# Patient Record
Sex: Female | Born: 1950 | Race: White | Hispanic: No | Marital: Married | Smoking: Never smoker
Health system: Southern US, Community
[De-identification: ages and names within clinical notes are randomized; demographics above are authoritative.]

## PROBLEM LIST (undated history)

## (undated) DIAGNOSIS — J449 Chronic obstructive pulmonary disease, unspecified: Secondary | ICD-10-CM

## (undated) DIAGNOSIS — F329 Major depressive disorder, single episode, unspecified: Secondary | ICD-10-CM

## (undated) DIAGNOSIS — Z9889 Other specified postprocedural states: Secondary | ICD-10-CM

## (undated) DIAGNOSIS — C801 Malignant (primary) neoplasm, unspecified: Secondary | ICD-10-CM

## (undated) DIAGNOSIS — T753XXA Motion sickness, initial encounter: Secondary | ICD-10-CM

## (undated) DIAGNOSIS — J90 Pleural effusion, not elsewhere classified: Secondary | ICD-10-CM

## (undated) DIAGNOSIS — J45909 Unspecified asthma, uncomplicated: Secondary | ICD-10-CM

## (undated) DIAGNOSIS — E892 Postprocedural hypoparathyroidism: Secondary | ICD-10-CM

## (undated) DIAGNOSIS — D649 Anemia, unspecified: Secondary | ICD-10-CM

## (undated) DIAGNOSIS — B001 Herpesviral vesicular dermatitis: Secondary | ICD-10-CM

## (undated) DIAGNOSIS — R079 Chest pain, unspecified: Secondary | ICD-10-CM

## (undated) DIAGNOSIS — D72819 Decreased white blood cell count, unspecified: Secondary | ICD-10-CM

## (undated) DIAGNOSIS — J189 Pneumonia, unspecified organism: Secondary | ICD-10-CM

## (undated) DIAGNOSIS — R112 Nausea with vomiting, unspecified: Secondary | ICD-10-CM

## (undated) DIAGNOSIS — F4321 Adjustment disorder with depressed mood: Secondary | ICD-10-CM

## (undated) DIAGNOSIS — I83893 Varicose veins of bilateral lower extremities with other complications: Secondary | ICD-10-CM

## (undated) DIAGNOSIS — N3090 Cystitis, unspecified without hematuria: Secondary | ICD-10-CM

## (undated) DIAGNOSIS — I209 Angina pectoris, unspecified: Secondary | ICD-10-CM

## (undated) DIAGNOSIS — D696 Thrombocytopenia, unspecified: Secondary | ICD-10-CM

## (undated) DIAGNOSIS — F419 Anxiety disorder, unspecified: Secondary | ICD-10-CM

## (undated) DIAGNOSIS — K219 Gastro-esophageal reflux disease without esophagitis: Secondary | ICD-10-CM

## (undated) DIAGNOSIS — M1990 Unspecified osteoarthritis, unspecified site: Secondary | ICD-10-CM

## (undated) DIAGNOSIS — Z87442 Personal history of urinary calculi: Secondary | ICD-10-CM

## (undated) DIAGNOSIS — E039 Hypothyroidism, unspecified: Secondary | ICD-10-CM

## (undated) DIAGNOSIS — E538 Deficiency of other specified B group vitamins: Secondary | ICD-10-CM

## (undated) DIAGNOSIS — N393 Stress incontinence (female) (male): Secondary | ICD-10-CM

## (undated) DIAGNOSIS — G4700 Insomnia, unspecified: Secondary | ICD-10-CM

## (undated) DIAGNOSIS — R600 Localized edema: Secondary | ICD-10-CM

## (undated) DIAGNOSIS — R0600 Dyspnea, unspecified: Secondary | ICD-10-CM

## (undated) DIAGNOSIS — N200 Calculus of kidney: Secondary | ICD-10-CM

## (undated) DIAGNOSIS — F32A Depression, unspecified: Secondary | ICD-10-CM

## (undated) HISTORY — PX: OTHER SURGICAL HISTORY: SHX169

## (undated) HISTORY — PX: APPENDECTOMY: SHX54

## (undated) HISTORY — DX: Gastro-esophageal reflux disease without esophagitis: K21.9

## (undated) HISTORY — PX: TONSILLECTOMY: SUR1361

## (undated) HISTORY — DX: Cystitis, unspecified without hematuria: N30.90

## (undated) HISTORY — DX: Calculus of kidney: N20.0

## (undated) HISTORY — PX: KIDNEY STONE SURGERY: SHX686

## (undated) HISTORY — DX: Varicose veins of bilateral lower extremities with other complications: I83.893

## (undated) HISTORY — DX: Malignant (primary) neoplasm, unspecified: C80.1

## (undated) HISTORY — DX: Unspecified asthma, uncomplicated: J45.909

---
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
## 1981-03-16 HISTORY — PX: ABDOMINAL HYSTERECTOMY: SHX81

## 1986-03-16 HISTORY — PX: THYROID SURGERY: SHX805

## 1987-03-17 DIAGNOSIS — J45909 Unspecified asthma, uncomplicated: Secondary | ICD-10-CM

## 1987-03-17 DIAGNOSIS — C801 Malignant (primary) neoplasm, unspecified: Secondary | ICD-10-CM

## 1987-03-17 HISTORY — DX: Unspecified asthma, uncomplicated: J45.909

## 1987-03-17 HISTORY — PX: KNEE SURGERY: SHX244

## 1987-03-17 HISTORY — DX: Malignant (primary) neoplasm, unspecified: C80.1

## 2000-03-16 HISTORY — PX: CHOLECYSTECTOMY: SHX55

## 2004-03-11 ENCOUNTER — Ambulatory Visit: Payer: Self-pay | Admitting: Internal Medicine

## 2005-03-12 ENCOUNTER — Ambulatory Visit: Payer: Self-pay | Admitting: Internal Medicine

## 2005-09-01 ENCOUNTER — Ambulatory Visit: Payer: Self-pay | Admitting: Unknown Physician Specialty

## 2006-03-16 DIAGNOSIS — F4320 Adjustment disorder, unspecified: Secondary | ICD-10-CM

## 2006-03-16 DIAGNOSIS — F4321 Adjustment disorder with depressed mood: Secondary | ICD-10-CM

## 2006-03-16 HISTORY — DX: Adjustment disorder with depressed mood: F43.21

## 2006-03-16 HISTORY — DX: Adjustment disorder, unspecified: F43.20

## 2006-05-27 ENCOUNTER — Ambulatory Visit: Payer: Self-pay | Admitting: Internal Medicine

## 2006-06-06 ENCOUNTER — Emergency Department: Payer: Self-pay | Admitting: Emergency Medicine

## 2007-03-17 HISTORY — PX: COLONOSCOPY: SHX174

## 2007-05-30 ENCOUNTER — Ambulatory Visit: Payer: Self-pay | Admitting: Internal Medicine

## 2007-09-06 ENCOUNTER — Ambulatory Visit: Payer: Self-pay | Admitting: Internal Medicine

## 2008-06-06 ENCOUNTER — Ambulatory Visit: Payer: Self-pay | Admitting: Internal Medicine

## 2008-07-11 ENCOUNTER — Emergency Department: Payer: Self-pay | Admitting: Emergency Medicine

## 2009-03-16 HISTORY — PX: TOTAL THYROIDECTOMY: SHX2547

## 2009-05-07 ENCOUNTER — Inpatient Hospital Stay: Payer: Self-pay | Admitting: Internal Medicine

## 2009-05-24 ENCOUNTER — Ambulatory Visit: Payer: Self-pay | Admitting: Internal Medicine

## 2009-06-03 ENCOUNTER — Encounter: Payer: Self-pay | Admitting: Otolaryngology

## 2009-06-06 ENCOUNTER — Ambulatory Visit: Payer: Self-pay | Admitting: General Surgery

## 2009-06-11 ENCOUNTER — Ambulatory Visit: Payer: Self-pay | Admitting: Internal Medicine

## 2009-06-12 ENCOUNTER — Ambulatory Visit: Payer: Self-pay | Admitting: Specialist

## 2009-06-13 ENCOUNTER — Ambulatory Visit: Payer: Self-pay | Admitting: Internal Medicine

## 2009-06-13 ENCOUNTER — Ambulatory Visit: Payer: Self-pay | Admitting: General Surgery

## 2009-06-14 ENCOUNTER — Encounter: Payer: Self-pay | Admitting: Otolaryngology

## 2009-06-14 ENCOUNTER — Ambulatory Visit: Payer: Self-pay | Admitting: Internal Medicine

## 2009-06-25 ENCOUNTER — Ambulatory Visit: Payer: Self-pay | Admitting: Otolaryngology

## 2009-06-27 ENCOUNTER — Ambulatory Visit: Payer: Self-pay | Admitting: Otolaryngology

## 2009-07-03 ENCOUNTER — Inpatient Hospital Stay: Payer: Self-pay | Admitting: Internal Medicine

## 2009-07-12 ENCOUNTER — Ambulatory Visit: Payer: Self-pay | Admitting: Internal Medicine

## 2009-07-14 ENCOUNTER — Ambulatory Visit: Payer: Self-pay | Admitting: Internal Medicine

## 2009-12-13 ENCOUNTER — Ambulatory Visit: Payer: Self-pay | Admitting: General Surgery

## 2010-03-16 HISTORY — PX: THROAT SURGERY: SHX803

## 2010-06-16 ENCOUNTER — Ambulatory Visit: Payer: Self-pay | Admitting: General Surgery

## 2010-06-26 ENCOUNTER — Ambulatory Visit: Payer: Self-pay | Admitting: General Surgery

## 2011-02-12 ENCOUNTER — Ambulatory Visit: Payer: Self-pay | Admitting: Otolaryngology

## 2011-03-05 ENCOUNTER — Encounter: Payer: Self-pay | Admitting: Otolaryngology

## 2011-03-17 ENCOUNTER — Encounter: Payer: Self-pay | Admitting: Otolaryngology

## 2011-03-17 HISTORY — PX: UPPER GI ENDOSCOPY: SHX6162

## 2011-04-06 ENCOUNTER — Ambulatory Visit: Payer: Self-pay | Admitting: Unknown Physician Specialty

## 2011-04-07 LAB — PATHOLOGY REPORT

## 2011-04-17 ENCOUNTER — Encounter: Payer: Self-pay | Admitting: Otolaryngology

## 2011-05-15 ENCOUNTER — Encounter: Payer: Self-pay | Admitting: Otolaryngology

## 2011-06-24 ENCOUNTER — Ambulatory Visit: Payer: Self-pay | Admitting: General Surgery

## 2012-03-16 DIAGNOSIS — N200 Calculus of kidney: Secondary | ICD-10-CM

## 2012-03-16 HISTORY — DX: Calculus of kidney: N20.0

## 2012-03-16 HISTORY — PX: KIDNEY STONE SURGERY: SHX686

## 2012-04-08 ENCOUNTER — Ambulatory Visit: Payer: Self-pay | Admitting: Internal Medicine

## 2012-04-12 ENCOUNTER — Ambulatory Visit: Payer: Self-pay | Admitting: Urology

## 2012-04-16 DIAGNOSIS — N200 Calculus of kidney: Secondary | ICD-10-CM | POA: Insufficient documentation

## 2012-04-18 ENCOUNTER — Ambulatory Visit: Payer: Self-pay | Admitting: Urology

## 2012-04-18 LAB — CBC WITH DIFFERENTIAL/PLATELET
Basophil #: 0 10*3/uL (ref 0.0–0.1)
Basophil %: 0.5 %
Eosinophil #: 0.1 10*3/uL (ref 0.0–0.7)
Eosinophil %: 1.6 %
HCT: 42.5 % (ref 35.0–47.0)
HGB: 13.8 g/dL (ref 12.0–16.0)
Lymphocyte #: 1.4 10*3/uL (ref 1.0–3.6)
Lymphocyte %: 22 %
MCHC: 32.4 g/dL (ref 32.0–36.0)
MCV: 96 fL (ref 80–100)
Monocyte #: 0.5 x10 3/mm (ref 0.2–0.9)
Monocyte %: 7.9 %
Neutrophil #: 4.3 10*3/uL (ref 1.4–6.5)
Platelet: 181 10*3/uL (ref 150–440)

## 2012-04-20 ENCOUNTER — Ambulatory Visit: Payer: Self-pay | Admitting: Urology

## 2012-05-07 ENCOUNTER — Encounter: Payer: Self-pay | Admitting: *Deleted

## 2012-06-24 ENCOUNTER — Ambulatory Visit: Payer: Self-pay | Admitting: General Surgery

## 2012-06-27 ENCOUNTER — Encounter: Payer: Self-pay | Admitting: General Surgery

## 2012-07-11 ENCOUNTER — Encounter: Payer: Self-pay | Admitting: General Surgery

## 2012-07-11 ENCOUNTER — Ambulatory Visit (INDEPENDENT_AMBULATORY_CARE_PROVIDER_SITE_OTHER): Payer: BC Managed Care – PPO | Admitting: General Surgery

## 2012-07-11 ENCOUNTER — Other Ambulatory Visit: Payer: Self-pay | Admitting: *Deleted

## 2012-07-11 VITALS — BP 140/80 | HR 86 | Resp 16 | Ht 70.0 in | Wt 293.0 lb

## 2012-07-11 DIAGNOSIS — Z1231 Encounter for screening mammogram for malignant neoplasm of breast: Secondary | ICD-10-CM

## 2012-07-11 DIAGNOSIS — Z1239 Encounter for other screening for malignant neoplasm of breast: Secondary | ICD-10-CM | POA: Insufficient documentation

## 2012-07-11 DIAGNOSIS — I83893 Varicose veins of bilateral lower extremities with other complications: Secondary | ICD-10-CM

## 2012-07-11 DIAGNOSIS — Z85850 Personal history of malignant neoplasm of thyroid: Secondary | ICD-10-CM | POA: Insufficient documentation

## 2012-07-11 NOTE — Progress Notes (Signed)
Patient ID: Maureen Brown, female   DOB: March 01, 1951, 62 y.o.   MRN: [id number]  Chief Complaint  Patient presents with  . Follow-up    mammogram     HPI Maureen Brown is a 62 y.o. female who presents for a follow up mammogram. The most recent mammogram was done on 06/24/12 with a birad category 2. The patient denies any new breasts problems at this time. The patient has a maternal grandmother with a history of breast cancer. The patient checks her breast regularly and gets regular mammograms. She previously was seen for a right breast lipoma in March 2014. Patient has also has varicose veins, prior Duplex study showed incompetent left GSV. Patient is wearing compression stockings with minimal relief of symptoms. The problem is mostly at night time with aching over the varicose veins and spider veins.  HPI  Past Medical History  Diagnosis Date  . Asthma 1989  . Cystitis, unspecified   . 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 Cancer 1989    thyroid  . Varicose veins of lower extremities with other complications   . GERD (gastroesophageal reflux disease)   . Kidney stone 2014    Past Surgical History  Procedure Laterality Date  . Colonoscopy  2009    Dr. Mechele Collin  . Breast biopsy Left 1973  . Knee surgery Right 1989  . Abdominal hysterectomy  1983  . Thyroid surgery  1988  . Cholecystectomy  2002  . Tonsillectomy    . Upper gi endoscopy  2013  . Throat surgery  2012    vocal cord surgery  . Kidney stone surgery  2014    Family History  Problem Relation Age of Onset  . Cancer Maternal Grandmother     breast    Social History History  Substance Use Topics  . Smoking status: Never Smoker   . Smokeless tobacco: Not on file  . Alcohol Use: Yes    Allergies  Allergen Reactions  . Codeine Nausea Only    dizziness  . Sulfa Antibiotics Hives  . Demerol (Meperidine) Rash    Throat itching  . Morphine And Related Rash     Current Outpatient Prescriptions  Medication Sig Dispense Refill  . ALPRAZolam (XANAX) 0.25 MG tablet Take 0.25 mg by mouth every 6 (six) hours as needed for sleep.      . budesonide-formoterol (SYMBICORT) 80-4.5 MCG/ACT inhaler Inhale 2 puffs into the lungs 2 (two) times daily.      . calcitRIOL (ROCALTROL) 0.5 MCG capsule Take 0.5 mcg by mouth 2 (two) times daily.      . Cholecalciferol (VITAMIN D) 1000 UNITS capsule Take 1,000 Units by mouth daily.      . furosemide (LASIX) 20 MG tablet Take 20 mg by mouth as needed.      . levalbuterol (XOPENEX HFA) 45 MCG/ACT inhaler Inhale 1-2 puffs into the lungs 2 (two) times daily as needed for wheezing.      Marland Kitchen levothyroxine (SYNTHROID, LEVOTHROID) 125 MCG tablet Take 125 mcg by mouth daily.      . naproxen (NAPROSYN) 500 MG tablet Take 500 mg by mouth as needed.       Marland Kitchen omeprazole (PRILOSEC) 40 MG capsule Take 40 mg by mouth 2 (two) times daily.      . ranitidine (ZANTAC) 150 MG capsule Take 150 mg by mouth every evening.      . valACYclovir (VALTREX) 1000 MG tablet Take 1,000 mg by  mouth 2 (two) times daily as needed.        No current facility-administered medications for this visit.    Review of Systems Review of Systems  Constitutional: Negative.   Respiratory: Negative.   Cardiovascular: Negative.     Blood pressure 140/80, pulse 86, resp. rate 16, height 5\' 10"  (1.778 m), weight 293 lb (132.904 kg).  Physical Exam Physical Exam  Constitutional: She is oriented to person, place, and time. She appears well-developed and well-nourished.  Eyes: Conjunctivae are normal. No scleral icterus.  Neck: Trachea normal. No mass and no thyromegaly present.  Cardiovascular: Normal rate, regular rhythm, normal heart sounds and normal pulses.   No murmur heard. Pulses:      Dorsalis pedis pulses are 2+ on the right side, and 2+ on the left side.       Posterior tibial pulses are 2+ on the right side, and 2+ on the left side.  Varicose Veins  noted at the inner aspect of left thigh and calf. A few on the right side laterally. Spider veins noted in lateral left calf. Mild skin discoloration medially above left ankle.   Pulmonary/Chest: Effort normal and breath sounds normal. Right breast exhibits no inverted nipple, no mass, no nipple discharge, no skin change and no tenderness. Left breast exhibits no inverted nipple, no mass, no nipple discharge, no skin change and no tenderness. Breasts are symmetrical.  Small lipoma at the 8 o'clock position from the nipple of the right breast.   Abdominal: Soft. Normal appearance and bowel sounds are normal. There is no hepatosplenomegaly. There is no tenderness. No hernia.  Lymphadenopathy:    She has no cervical adenopathy.    She has no axillary adenopathy.  Neurological: She is alert and oriented to person, place, and time.  Skin: Skin is warm and dry.    Data Reviewed Mammogram reviewed-stable  Assessment    Breast screening, VV, stable exam.     Plan    1 yr f/u for breast and VV. Discussed options of RF ablation . Sclerotherapy.        SANKAR,SEEPLAPUTHUR G 07/11/2012, 9:49 AM

## 2012-07-11 NOTE — Progress Notes (Signed)
Patient will be asked to return to the office in one year for a bilateral screening mammogram. 

## 2012-07-11 NOTE — Patient Instructions (Addendum)
Continue with compression stockings. Patient to think about having RF ablation of left GSV and or scleratherapy of spider veins.  Mammogram in 1 year with follow up visit.

## 2012-08-03 DIAGNOSIS — R82994 Hypercalciuria: Secondary | ICD-10-CM | POA: Insufficient documentation

## 2012-08-03 DIAGNOSIS — R82992 Hyperoxaluria: Secondary | ICD-10-CM | POA: Insufficient documentation

## 2012-08-10 ENCOUNTER — Ambulatory Visit (INDEPENDENT_AMBULATORY_CARE_PROVIDER_SITE_OTHER): Payer: BC Managed Care – PPO | Admitting: General Surgery

## 2012-08-10 ENCOUNTER — Encounter: Payer: Self-pay | Admitting: General Surgery

## 2012-08-10 VITALS — BP 134/94 | HR 72 | Resp 16 | Ht 70.0 in | Wt 294.0 lb

## 2012-08-10 DIAGNOSIS — I83893 Varicose veins of bilateral lower extremities with other complications: Secondary | ICD-10-CM

## 2012-08-10 NOTE — Progress Notes (Signed)
Patient ID: Maureen Brown, female   DOB: 09-10-1950, 62 y.o.   MRN: [id number]  Chief Complaint  Patient presents with  . Follow-up    leg  reassessment    HPI Maureen Brown is a 62 y.o. female.  Patient here today for follow up has varicose veins, prior Duplex study showed incompetent left GSV. Patient is wearing compression stockings with minimal relief of symptoms. The problem is mostly at night time with aching over the varicose veins and spider veins. Left leg feels heavy at night and throbs.  She has noticed swelling at the end of the day. Frequently uses Tylenol for symptoms. Now ither left leg symptoms seem to interfere with household chores and her work also.Concerned about generalized aching and swelling in left leg and focal discomfort over a cluster of veins lateral left calf. Few mos ago she decided to just treat the spider veins but since then she has noted more generalised left leg symptoms and wishes to proceed with RF ablation and stab phlebectomy/sclerotherapy.  HPI  Past Medical History  Diagnosis Date  . Asthma 1989  . Cystitis, unspecified   . 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 Cancer 1989    thyroid  . Varicose veins of lower extremities with other complications   . GERD (gastroesophageal reflux disease)   . Kidney stone 2014    Past Surgical History  Procedure Laterality Date  . Colonoscopy  2009    Dr. Mechele Collin  . Breast biopsy Left 1973  . Knee surgery Right 1989  . Abdominal hysterectomy  1983  . Thyroid surgery  1988  . Cholecystectomy  2002  . Tonsillectomy    . Upper gi endoscopy  2013  . Throat surgery  2012    vocal cord surgery  . Kidney stone surgery  2014    Family History  Problem Relation Age of Onset  . Cancer Maternal Grandmother     breast    Social History History  Substance Use Topics  . Smoking status: Never Smoker   . Smokeless tobacco: Not on file  . Alcohol Use: Yes     Allergies  Allergen Reactions  . Codeine Nausea Only    dizziness  . Sulfa Antibiotics Hives  . Demerol (Meperidine) Rash    Throat itching  . Morphine And Related Rash    Current Outpatient Prescriptions  Medication Sig Dispense Refill  . ALPRAZolam (XANAX) 0.25 MG tablet Take 0.25 mg by mouth every 6 (six) hours as needed for sleep.      . budesonide-formoterol (SYMBICORT) 80-4.5 MCG/ACT inhaler Inhale 2 puffs into the lungs 2 (two) times daily.      . calcitRIOL (ROCALTROL) 0.5 MCG capsule Take 0.5 mcg by mouth 2 (two) times daily.      . Cholecalciferol (VITAMIN D) 1000 UNITS capsule Take 1,000 Units by mouth daily.      . furosemide (LASIX) 20 MG tablet Take 20 mg by mouth as needed.      . levalbuterol (XOPENEX HFA) 45 MCG/ACT inhaler Inhale 1-2 puffs into the lungs 2 (two) times daily as needed for wheezing.      Marland Kitchen levothyroxine (SYNTHROID, LEVOTHROID) 125 MCG tablet Take 125 mcg by mouth daily.      . naproxen (NAPROSYN) 500 MG tablet Take 500 mg by mouth as needed.       Marland Kitchen omeprazole (PRILOSEC) 40 MG capsule Take 40 mg by mouth 2 (two) times daily.      Marland Kitchen  ranitidine (ZANTAC) 150 MG capsule Take 150 mg by mouth every evening.      . valACYclovir (VALTREX) 1000 MG tablet Take 1,000 mg by mouth 2 (two) times daily as needed.        No current facility-administered medications for this visit.    Review of Systems Review of Systems  Constitutional: Negative.   Respiratory: Negative.   Cardiovascular: Negative.     Blood pressure 134/94, pulse 72, resp. rate 16, height 5\' 10"  (1.778 m), weight 294 lb (133.358 kg).  Physical Exam Physical Exam  Constitutional: She is oriented to person, place, and time. She appears well-developed and well-nourished.  Neurological: She is alert and oriented to person, place, and time.  Skin: Skin is warm and dry.  Varicose veins seen in left medial thigh up to 8 mm size and scattered cluster of spider veins and retciuveins in calf  area. Mild edema in left ankle.  Right leg free of similar findings. Pedal pulses are strong.  Data Reviewed Prior duplex was reviewed.  Limited duplex study of left thigh shows again left GVS lateral branch with reflux in excess of 2.5 seconds. The main GSV is normal. Varicose veins connect to lateral branch.  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 Failed conservative managment.   Patient is willing to undergo RF ablation of the GSV in left leg with stab pheblectomy with sclerotherapy after. Plan    Will reschedule about procedures after insurance verification.    Limited Duplex study of left thigh was performed. The pt has a prominent lateral branch of GSV , measures 0.8 cm. It is not tortuous or aneurysmal. It has reflux in excess of 2.8 secs. VV connect to this in distal thigh. The main GSV is small and has no reflux. Prior Duplex study has shown no deep vein abnormality and a normal LSV   Rutger Salton G 08/10/2012, 12:54 PM

## 2012-08-10 NOTE — Patient Instructions (Addendum)
The patient is aware to call back for any questions or concerns. Procedure reviewed-RF ablation, stab phlebectomy and sclerotherapy.Pt is agreeable.

## 2012-08-18 ENCOUNTER — Telehealth: Payer: Self-pay | Admitting: *Deleted

## 2012-08-18 NOTE — Telephone Encounter (Signed)
Left message for pt to call to review dates for vein procedures to make sure they are ok with her.

## 2012-09-13 ENCOUNTER — Encounter: Payer: Self-pay | Admitting: General Surgery

## 2012-09-13 HISTORY — PX: ABLATION SAPHENOUS VEIN W/ RFA: SUR11

## 2012-10-11 ENCOUNTER — Encounter: Payer: Self-pay | Admitting: General Surgery

## 2012-10-11 ENCOUNTER — Ambulatory Visit (INDEPENDENT_AMBULATORY_CARE_PROVIDER_SITE_OTHER): Payer: BC Managed Care – PPO | Admitting: General Surgery

## 2012-10-11 VITALS — BP 138/94 | HR 80 | Resp 14 | Ht 70.0 in | Wt 297.0 lb

## 2012-10-11 DIAGNOSIS — I83893 Varicose veins of bilateral lower extremities with other complications: Secondary | ICD-10-CM

## 2012-10-11 NOTE — Progress Notes (Signed)
Subjective:     Patient ID: Maureen Brown, female   DOB: [birth year], 62 y.o.   MRN: [id number]  HPI Patient here today for left leg ALV vein procedure.  Review of Systems     Objective:   Physical Exam     Assessment:     VV left symptomatic     Plan:     RF ablation left GSV done today      ENDOVENOUS RADIOFREQUENCY ABLATION REPORT  Name:  Maureen Brown DOB:  15-Apr-1950  Diagnosis:  Reflux with multiple tributary varicosities on left Operation: Endovenous radiofrequency ablation of theleft GSV-l;ateral branch Vital signs:BP 138/94  Pulse 80  Resp 14  Ht 5\' 10"  (1.778 m)  Wt 297 lb (134.718 kg)  BMI 42.61 kg/m2  Anesthesia:   Local infiltration of 6ml 1% Xylocaine and 75ml of tumenscence  Procedure:  The patient was transferred to the procedure suite and the insufficient vein was mapped by ultrasound and marked on the overlying skin. The patient was then positioned supine on the procedure table.  The limb was sterilely prepped and draped. The RF closurefast catheter was placed in a sterile field.  The patient was placed in reverse-Trendelenburg position and local anesthesia was instilled in the skin overlying the access site. A skin incision was made overlying the identified and mapped entry site. The vein was punctured through the incision, and using ultrasound guidance and the Seldinger technique, a guidewire was introduced through the needle, which was then exchanged over the guidewire for a sheath. The guidewire was removed and the sheath was flushed. The RF probe was placed into the vein through the sheath and positioned at a point just distal (about 2 cm) to the junction using ultrasound guidance.  After the RF probe position was verified by ultrasound, tumescent anesthesia was infiltrated, under ultrasound guidance, precisely into the perivenous compartment along the entire length of the vein from the entry site to the saphenofemoral junction until a "halo" of  fluid was noted from the vein.  The patient was then placed in Trendelenburg position. RF energy was applied to the 4 segments of 3.0 cm each.  Repeat ultrasound of the saphenous vein was performed, confirming successful treatment. The catheter and sheath were withdrawn and hemostasis established with direct pressure.  After assuring hemostasis, the skin incision over the saphenous vein was closed with a bandage and a compression wrap. Patient experienced no immediate complications.  CC:  Maureen Brown

## 2012-10-11 NOTE — Patient Instructions (Addendum)
Instructions reviewed with patient Follow up as scheduled Leave compression wrap in place left leg

## 2012-10-13 ENCOUNTER — Ambulatory Visit (INDEPENDENT_AMBULATORY_CARE_PROVIDER_SITE_OTHER): Payer: BC Managed Care – PPO | Admitting: General Surgery

## 2012-10-13 ENCOUNTER — Encounter: Payer: Self-pay | Admitting: General Surgery

## 2012-10-13 VITALS — BP 130/74 | Ht 70.0 in | Wt 296.0 lb

## 2012-10-13 DIAGNOSIS — I83893 Varicose veins of bilateral lower extremities with other complications: Secondary | ICD-10-CM

## 2012-10-13 NOTE — Patient Instructions (Addendum)
Wear compression stockings. Follow up as scheduled.

## 2012-10-13 NOTE — Progress Notes (Signed)
Patient ID: Maureen Brown, female   DOB: 12/21/1950, 62 y.o.   MRN: [id number] Patient here for 2 day post left leg ALV ablation follow up. She states that she is doing well.   Puncture sites are clean, no leg edema.  Ultrasound preformed today. Left CFV is normal. Treated GSV is non compressible and no flow in it. It stays like this for the 12-14 cm wfre it is connected with a large tributary  Successful treatment. Continue with compression stockings. Return as scheduled for stab phlebectomy.

## 2012-10-14 ENCOUNTER — Encounter: Payer: Self-pay | Admitting: General Surgery

## 2012-10-25 ENCOUNTER — Encounter: Payer: Self-pay | Admitting: General Surgery

## 2012-10-25 ENCOUNTER — Ambulatory Visit (INDEPENDENT_AMBULATORY_CARE_PROVIDER_SITE_OTHER): Payer: BC Managed Care – PPO | Admitting: General Surgery

## 2012-10-25 VITALS — BP 126/84 | HR 78 | Resp 16 | Ht 70.0 in | Wt 297.0 lb

## 2012-10-25 DIAGNOSIS — I83893 Varicose veins of bilateral lower extremities with other complications: Secondary | ICD-10-CM

## 2012-10-25 NOTE — Progress Notes (Signed)
Patient here today for left leg stab phlebectomy. States left leg has been swollen some and tender in left upper thigh. Exam showed mild redness over the VV in lower left thigh. NO VV visible. It is possible this will allow the VV to sclerose.  Will reassess in 3 weeks.

## 2012-10-25 NOTE — Patient Instructions (Addendum)
Aleve for comfort as needed Wear compression hose

## 2012-10-27 ENCOUNTER — Ambulatory Visit: Payer: Self-pay | Admitting: General Surgery

## 2012-10-28 ENCOUNTER — Telehealth: Payer: Self-pay | Admitting: General Surgery

## 2012-10-28 NOTE — Telephone Encounter (Signed)
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

## 2012-11-17 ENCOUNTER — Ambulatory Visit (INDEPENDENT_AMBULATORY_CARE_PROVIDER_SITE_OTHER): Payer: BC Managed Care – PPO | Admitting: General Surgery

## 2012-11-17 ENCOUNTER — Encounter: Payer: Self-pay | Admitting: General Surgery

## 2012-11-17 VITALS — BP 134/72 | HR 76 | Resp 12 | Ht 71.0 in | Wt 296.0 lb

## 2012-11-17 DIAGNOSIS — I781 Nevus, non-neoplastic: Secondary | ICD-10-CM

## 2012-11-17 DIAGNOSIS — I83893 Varicose veins of bilateral lower extremities with other complications: Secondary | ICD-10-CM

## 2012-11-17 DIAGNOSIS — D4701 Cutaneous mastocytosis: Secondary | ICD-10-CM | POA: Insufficient documentation

## 2012-11-17 NOTE — Patient Instructions (Addendum)
Compression hose to be used daily.

## 2012-11-17 NOTE — Progress Notes (Signed)
Patient ID: Maureen Brown, female   DOB: 1950/05/13, 62 y.o.   MRN: [id number]  Chief Complaint  Patient presents with  . Procedure    left leg sclerotherapy    HPI Maureen Brown is a 62 y.o. female who presents for a left leg sclerotherapy procedure.   HPI  Past Medical History  Diagnosis Date  . Asthma 1989  . Cystitis, unspecified   . 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 Cancer 1989    thyroid  . Varicose veins of lower extremities with other complications   . GERD (gastroesophageal reflux disease)   . Kidney stone 2014    Past Surgical History  Procedure Laterality Date  . Colonoscopy  2009    Dr. Mechele Collin  . Breast biopsy Left 1973  . Knee surgery Right 1989  . Abdominal hysterectomy  1983  . Thyroid surgery  1988  . Cholecystectomy  2002  . Tonsillectomy    . Upper gi endoscopy  2013  . Throat surgery  2012    vocal cord surgery  . Kidney stone surgery  2014    Family History  Problem Relation Age of Onset  . Cancer Maternal Grandmother     breast    Social History History  Substance Use Topics  . Smoking status: Never Smoker   . Smokeless tobacco: Not on file  . Alcohol Use: Yes    Allergies  Allergen Reactions  . Codeine Nausea Only    dizziness  . Sulfa Antibiotics Hives  . Demerol [Meperidine] Rash    Throat itching  . Morphine And Related Rash    Current Outpatient Prescriptions  Medication Sig Dispense Refill  . ALPRAZolam (XANAX) 0.25 MG tablet Take 0.25 mg by mouth every 6 (six) hours as needed for sleep.      . budesonide-formoterol (SYMBICORT) 80-4.5 MCG/ACT inhaler Inhale 2 puffs into the lungs 2 (two) times daily.      . calcitRIOL (ROCALTROL) 0.5 MCG capsule Take 0.5 mcg by mouth 2 (two) times daily.      . Cholecalciferol (VITAMIN D) 1000 UNITS capsule Take 1,000 Units by mouth daily.      . furosemide (LASIX) 20 MG tablet Take 20 mg by mouth as needed.      . levalbuterol  (XOPENEX HFA) 45 MCG/ACT inhaler Inhale 1-2 puffs into the lungs 2 (two) times daily as needed for wheezing.      Marland Kitchen levothyroxine (SYNTHROID, LEVOTHROID) 125 MCG tablet Take 125 mcg by mouth daily.      . naproxen (NAPROSYN) 500 MG tablet Take 500 mg by mouth as needed.       Marland Kitchen omeprazole (PRILOSEC) 40 MG capsule Take 40 mg by mouth 2 (two) times daily.      . ranitidine (ZANTAC) 150 MG capsule Take 150 mg by mouth every evening.      . valACYclovir (VALTREX) 1000 MG tablet Take 1,000 mg by mouth 2 (two) times daily as needed.        No current facility-administered medications for this visit.    Review of Systems Review of Systems  Constitutional: Negative.   Respiratory: Negative.   Cardiovascular: Negative.     Blood pressure 134/72, pulse 76, resp. rate 12, height 5\' 11"  (1.803 m), weight 296 lb (134.265 kg).  Physical Exam Physical Exam  Constitutional: She appears well-developed and well-nourished.  Neurological: She is alert.  Skin: Skin is warm and dry.  All varicose veins in left  leg have  resolved.  Data Reviewed none  Assessment    Superficial phlebitis resolved.  Varicose veins not visible. Spider veins in the left calf and knee area 3 clusters mildly symptomatic. Sclerotherapy preformed on these.    Plan    1ml of 1% Sotradecol mixed with 1ml saline Used. 30 g needle employed to inject the spider clusters.         SANKAR,SEEPLAPUTHUR G 11/17/2012, 6:42 PM

## 2012-12-08 ENCOUNTER — Ambulatory Visit (INDEPENDENT_AMBULATORY_CARE_PROVIDER_SITE_OTHER): Payer: BC Managed Care – PPO | Admitting: General Surgery

## 2012-12-08 ENCOUNTER — Encounter: Payer: Self-pay | Admitting: General Surgery

## 2012-12-08 VITALS — BP 118/80 | HR 80 | Resp 16 | Ht 70.0 in | Wt 301.0 lb

## 2012-12-08 DIAGNOSIS — I83893 Varicose veins of bilateral lower extremities with other complications: Secondary | ICD-10-CM

## 2012-12-08 NOTE — Progress Notes (Signed)
Patient ID: Maureen Brown, female   DOB: [birth year], 62 y.o.   MRN: [id number]  Chief Complaint  Patient presents with  . Other    spider veins    HPI Maureen Brown is a 62 y.o. female who presents for a 3 week follow up for varicose veins and spider veins. She denies any new complaints at this time.    HPI  Past Medical History  Diagnosis Date  . Asthma 1989  . Cystitis, unspecified   . 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 Cancer 1989    thyroid  . Varicose veins of lower extremities with other complications   . GERD (gastroesophageal reflux disease)   . Kidney stone 2014    Past Surgical History  Procedure Laterality Date  . Colonoscopy  2009    Dr. Mechele Collin  . Breast biopsy Left 1973  . Knee surgery Right 1989  . Abdominal hysterectomy  1983  . Thyroid surgery  1988  . Cholecystectomy  2002  . Tonsillectomy    . Upper gi endoscopy  2013  . Throat surgery  2012    vocal cord surgery  . Kidney stone surgery  2014    Family History  Problem Relation Age of Onset  . Cancer Maternal Grandmother     breast    Social History History  Substance Use Topics  . Smoking status: Never Smoker   . Smokeless tobacco: Not on file  . Alcohol Use: Yes    Allergies  Allergen Reactions  . Codeine Nausea Only    dizziness  . Sulfa Antibiotics Hives  . Demerol [Meperidine] Rash    Throat itching  . Morphine And Related Rash    Current Outpatient Prescriptions  Medication Sig Dispense Refill  . ALPRAZolam (XANAX) 0.25 MG tablet Take 0.25 mg by mouth every 6 (six) hours as needed for sleep.      . budesonide-formoterol (SYMBICORT) 80-4.5 MCG/ACT inhaler Inhale 2 puffs into the lungs 2 (two) times daily.      . calcitRIOL (ROCALTROL) 0.5 MCG capsule Take 0.5 mcg by mouth 2 (two) times daily.      . Cholecalciferol (VITAMIN D) 1000 UNITS capsule Take 1,000 Units by mouth daily.      . furosemide (LASIX) 20 MG tablet  Take 20 mg by mouth as needed.      . levalbuterol (XOPENEX HFA) 45 MCG/ACT inhaler Inhale 1-2 puffs into the lungs 2 (two) times daily as needed for wheezing.      Marland Kitchen levothyroxine (SYNTHROID, LEVOTHROID) 125 MCG tablet Take 125 mcg by mouth daily.      . naproxen (NAPROSYN) 500 MG tablet Take 500 mg by mouth as needed.       Marland Kitchen omeprazole (PRILOSEC) 40 MG capsule Take 40 mg by mouth 2 (two) times daily.      . ranitidine (ZANTAC) 150 MG capsule Take 150 mg by mouth every evening.      . valACYclovir (VALTREX) 1000 MG tablet Take 1,000 mg by mouth 2 (two) times daily as needed.        No current facility-administered medications for this visit.    Review of Systems Review of Systems  Constitutional: Negative.   Respiratory: Negative.   Cardiovascular: Negative.     Blood pressure 118/80, pulse 80, resp. rate 16, height 5\' 10"  (1.778 m), weight 301 lb (136.533 kg).  Physical Exam Physical Exam Injected spider veins in the left lower thigh and calf look  better. No skin involvement noted. No varicose veins noticeable.   Data Reviewed None  Assessment    Good results post RF ablation of GSV and scleratherapy. Patient symptomatically much improved.     Plan    Follow up in 6 months or as needed.        [name] G 12/08/2012, 7:11 PM

## 2012-12-08 NOTE — Patient Instructions (Addendum)
Patient advised to continue wearing her compression stockings. Patient to return in 7 months.

## 2013-05-05 ENCOUNTER — Ambulatory Visit: Payer: Self-pay | Admitting: Urology

## 2013-05-31 DIAGNOSIS — N393 Stress incontinence (female) (male): Secondary | ICD-10-CM | POA: Insufficient documentation

## 2013-05-31 DIAGNOSIS — N3941 Urge incontinence: Secondary | ICD-10-CM | POA: Insufficient documentation

## 2013-06-13 ENCOUNTER — Ambulatory Visit: Payer: Self-pay | Admitting: Urology

## 2013-06-16 DIAGNOSIS — N23 Unspecified renal colic: Secondary | ICD-10-CM | POA: Insufficient documentation

## 2013-06-26 ENCOUNTER — Ambulatory Visit: Payer: Self-pay | Admitting: Urology

## 2013-06-26 ENCOUNTER — Ambulatory Visit: Payer: Self-pay | Admitting: General Surgery

## 2013-06-27 ENCOUNTER — Ambulatory Visit: Payer: Self-pay | Admitting: Urology

## 2013-07-17 ENCOUNTER — Encounter: Payer: Self-pay | Admitting: General Surgery

## 2013-07-17 ENCOUNTER — Ambulatory Visit (INDEPENDENT_AMBULATORY_CARE_PROVIDER_SITE_OTHER): Payer: BC Managed Care – PPO | Admitting: General Surgery

## 2013-07-17 VITALS — BP 132/78 | HR 74 | Resp 14 | Ht 70.0 in | Wt 288.0 lb

## 2013-07-17 DIAGNOSIS — Z1239 Encounter for other screening for malignant neoplasm of breast: Secondary | ICD-10-CM

## 2013-07-17 DIAGNOSIS — Z85850 Personal history of malignant neoplasm of thyroid: Secondary | ICD-10-CM

## 2013-07-17 DIAGNOSIS — I83893 Varicose veins of bilateral lower extremities with other complications: Secondary | ICD-10-CM

## 2013-07-17 NOTE — Progress Notes (Signed)
Patient ID: Maureen Brown, female   DOB: 1950/09/18, 63 y.o.   MRN: [id number]  Chief Complaint  Patient presents with  . Follow-up    breast and varicose veins    HPI Maureen Brown is a 63 y.o. female who presents for a breast evaluation. The most recent mammogram was done on 06/26/13. She denies any new problems.  Patient does perform regular self breast checks and gets regular mammograms done. The patient is also here for vein check of the legs. She recently had kidney stone removal surgery on 06/27/13.  HPI  Past Medical History  Diagnosis Date  . Asthma 1989  . Cystitis, unspecified   . Varicose veins of lower extremities with other complications   . GERD (gastroesophageal reflux disease)   . Kidney stone 2014  . Cancer 1989    thyroid    Past Surgical History  Procedure Laterality Date  . Colonoscopy  2009    Dr. Vira Agar  . Breast biopsy Left 1973  . Knee surgery Right 1989  . Abdominal hysterectomy  1983  . Thyroid surgery  1988  . Cholecystectomy  2002  . Tonsillectomy    . Upper gi endoscopy  2013  . Throat surgery  2012    vocal cord surgery  . Kidney stone surgery  2014  . Kidney stone surgery  06/27/13    Dr Jacqlyn Larsen  . Ablation saphenous vein w/ rfa Left 09/2012    GSV    Family History  Problem Relation Age of Onset  . Cancer Maternal Grandmother     breast    Social History History  Substance Use Topics  . Smoking status: Never Smoker   . Smokeless tobacco: Never Used  . Alcohol Use: Yes    Allergies  Allergen Reactions  . Codeine Nausea Only    dizziness  . Sulfa Antibiotics Hives  . Demerol [Meperidine] Rash    Throat itching  . Morphine And Related Rash    Current Outpatient Prescriptions  Medication Sig Dispense Refill  . ALPRAZolam (XANAX) 0.25 MG tablet Take 0.25 mg by mouth every 6 (six) hours as needed for sleep.      . budesonide-formoterol (SYMBICORT) 80-4.5 MCG/ACT inhaler Inhale 2 puffs into the lungs 2 (two) times daily.       . calcitRIOL (ROCALTROL) 0.5 MCG capsule Take 0.5 mcg by mouth 2 (two) times daily.      . Cholecalciferol (VITAMIN D) 1000 UNITS capsule Take 1,000 Units by mouth daily.      . furosemide (LASIX) 20 MG tablet Take 20 mg by mouth as needed.      . levalbuterol (XOPENEX HFA) 45 MCG/ACT inhaler Inhale 1-2 puffs into the lungs 2 (two) times daily as needed for wheezing.      Marland Kitchen levothyroxine (SYNTHROID, LEVOTHROID) 125 MCG tablet Take 125 mcg by mouth daily.      . naproxen (NAPROSYN) 500 MG tablet Take 500 mg by mouth as needed.       . nystatin-triamcinolone (MYCOLOG II) cream       . omeprazole (PRILOSEC) 40 MG capsule Take 40 mg by mouth 2 (two) times daily.      . potassium citrate (UROCIT-K) 10 MEQ (1080 MG) SR tablet Take 10 mEq by mouth.       . ranitidine (ZANTAC) 150 MG capsule Take 150 mg by mouth every evening.      Marland Kitchen tiZANidine (ZANAFLEX) 4 MG tablet Take 4 mg by mouth once.       Marland Kitchen  valACYclovir (VALTREX) 1000 MG tablet Take 1,000 mg by mouth 2 (two) times daily as needed.       . chlorpheniramine-HYDROcodone (TUSSIONEX) 10-8 MG/5ML LQCR Take 5 mLs by mouth.        No current facility-administered medications for this visit.    Review of Systems Review of Systems  Constitutional: Negative.   Respiratory: Negative.   Cardiovascular: Negative.     Blood pressure 132/78, pulse 74, resp. rate 14, height 5\' 10"  (1.778 m), weight 288 lb (130.636 kg).  Physical Exam Physical Exam  Constitutional: She is oriented to person, place, and time. She appears well-developed and well-nourished.  Eyes: Conjunctivae are normal.  Neck: Neck supple.  Cardiovascular: Normal rate, regular rhythm, normal heart sounds, intact distal pulses and normal pulses.   Pulses:      Dorsalis pedis pulses are 2+ on the right side, and 2+ on the left side.       Posterior tibial pulses are 2+ on the right side, and 2+ on the left side.  No edema Spider veins noted both sides, no visible VV. No stasis  changes  Pulmonary/Chest: Effort normal and breath sounds normal. Right breast exhibits no inverted nipple, no mass, no nipple discharge, no skin change and no tenderness. Left breast exhibits no inverted nipple, no mass, no nipple discharge, no skin change and no tenderness.  Abdominal: Soft. Bowel sounds are normal. There is no tenderness.  Lymphadenopathy:    She has no cervical adenopathy.    She has no axillary adenopathy.  Neurological: She is alert and oriented to person, place, and time.  Skin: Skin is warm and dry.  No visible varicose veins    Data Reviewed Mammogram reviewed-stable   Assessment    Stable exam. VV, asymptomatic. History of FCD     Plan    Patient to return one year bilateral screening mammogram.         Seeplaputhur G Sankar 07/17/2013, 11:38 AM

## 2013-07-17 NOTE — Patient Instructions (Addendum)
Patient to return one year bilateral screening mammogram and vv. Continue self breast exams. Call office for any new breast issues or concerns.

## 2013-07-24 ENCOUNTER — Ambulatory Visit: Payer: Self-pay | Admitting: Urology

## 2013-12-20 DIAGNOSIS — M1990 Unspecified osteoarthritis, unspecified site: Secondary | ICD-10-CM | POA: Insufficient documentation

## 2014-01-09 DIAGNOSIS — R310 Gross hematuria: Secondary | ICD-10-CM | POA: Insufficient documentation

## 2014-01-15 ENCOUNTER — Encounter: Payer: Self-pay | Admitting: General Surgery

## 2014-06-28 ENCOUNTER — Other Ambulatory Visit: Payer: Self-pay

## 2014-06-28 DIAGNOSIS — Z1231 Encounter for screening mammogram for malignant neoplasm of breast: Secondary | ICD-10-CM

## 2014-07-06 NOTE — Op Note (Signed)
PATIENT NAME:  Maureen Brown, Maureen Brown MR#:  [id number] DATE OF BIRTH:  Oct 22, 1950  DATE OF PROCEDURE:  04/20/2012  PREOPERATIVE DIAGNOSES:  1.  Right mid ureteral calculus.  2.  Right renal colic.   POSTOPERATIVE DIAGNOSES: 1.  Right mid ureteral calculus.  2.  Right renal colic.   PROCEDURES:  1.  Right ureteroscopy with Holmium laser lithotripsy/stone extraction.  2. Placement of a right ureteral stent.   SURGEON: John Giovanni, M.D.   ASSISTANT: None.   ANESTHESIA: General/LMA.   INDICATIONS: A 64 year old female initially evaluated by Dr. Caryl Comes on 01/22 with right flank pain. Stone protocol CT showed a 6 mm right mid ureteral calculus with moderate hydronephrosis. She was initially seen by me on 01/28 and KUB showed non-progression of the stone. She has had intermittent symptoms and has elected ureteroscopic removal.   DESCRIPTION OF PROCEDURE: She was taken to the operating room where a general anesthetic was administered via a LMA. She was placed in the low lithotomy position and her external genitalia were prepped and draped in the usual fashion. Timeout was performed. A 21-French cystoscope sheath with obturator was lubricated and passed per urethra. Panendoscopy was performed, which showed shows normal appearing bladder mucosa without erythema, solid or papillary lesions. The left ureteral orifice was normal-appearing with clear efflux. No efflux was seen from the right ureteral orifice. A 0.035 guidewire was placed through the cystoscope and into the right ureteral orifice. The wire could not be negotiated past the stone. The cystoscope was removed. A 6-French semirigid ureteroscope was passed per urethra. The guidewire was placed through the ureteroscope and into the right ureteral orifice. The ureteroscope was advanced over the wire and advanced proximally where a stone was visualized in the mid ureter. There was marked ureteral edema present. The guidewire was passed beyond the stone  under direct vision. The ureteroscope was removed and then repassed alongside the wire. A 365 Holmium laser fiber was then placed through the ureteroscope and the stone was easily fragmented at a power setting of 5 watts. All fragments were removed with a 3-French Nitinol basket. Ureteroscope was re-advanced up to the ureteropelvic junction. There were some minute particles remaining, but no significant fragments. The ureteroscope was removed. A stent placement was elected and a 6-French/24 cm Contour ureteral stent was placed over the wire. There was good curl seen in the renal pelvis on fluoroscopy. The ureteroscope was repassed and the distal end of the stent was well positioned in the bladder. The stent will be removed at a later date via a pull string. A venous pump was placed per rectum. She was taken to PACU in stable condition. There were no complications. EBL minimal.    ____________________________ Ronda Fairly. Bernardo Heater, MD scs:aw D: 04/21/2012 08:12:33 ET T: 04/21/2012 08:22:39 ET JOB#: [id number]  cc: Nicki Reaper C. Bernardo Heater, MD, <Dictator> Abbie Sons MD ELECTRONICALLY SIGNED 04/22/2012 7:41

## 2014-07-07 NOTE — Op Note (Signed)
PATIENT NAME:  Maureen Brown, Maureen Brown MR#:  [id number] DATE OF BIRTH:  [birth year]  DATE OF PROCEDURE:  06/27/2013  PRINCIPAL DIAGNOSES: Left ureterolithiasis, right nephrolithiasis, bilateral renal colic.   POSTOPERATIVE DIAGNOSES:  Left ureterolithiasis, right nephrolithiasis, bilateral renal colic.   PROCEDURE:  Right ureteroscopy with holmium laser lithotripsy, left retrograde pyelogram.   SURGEON: Edrick Oh, M.D.   ANESTHESIA:  Laryngeal mask airway anesthesia.   INDICATIONS: The patient is a 64 year old white female with a history of nephrolithiasis. She underwent recent evaluation demonstrating bilateral renal calculi. She developed sudden-onset, left-sided flank pain. There was no evidence of stone passage on plain film. A subsequent CT scan was obtained demonstrating 2 calculi within the distal right ureter with partial obstruction. The lead stone was approximately 3 mm with a subsequent stone at 5 mm. She also has a 7 mm stone in the left kidney. There was no evidence of any left ureteral stones. She has had continued pain and discomfort. The decision was made to proceed with further evaluation and removal of the right ureteral stones and evaluate on the left for possible drop of the renal calculus.   DESCRIPTION OF PROCEDURE: After informed consent was obtained, the patient was taken to the Operating Room and placed in the dorsal lithotomy position under laryngeal mask airway anesthesia. The patient was then prepped and draped in the usual standard fashion. The 22-French rigid cystoscope was introduced into the urethra under direct vision with no urethral abnormalities noted. Upon entering the bladder, the mucosa was inspected in its entirety with no gross mucosal lesions noted. Bilateral ureteral orifices were well visualized with no lesions noted. A flexible-tip Glidewire was introduced into the right ureteral orifice. Resistance was met approximately 2 cm into the orifice. Multiple attempts  were made at passing the stone into the more proximal portion of the collecting system. This was unsuccessful due to stone obstruction. The cystoscope was removed. The 6-French rigid ureteroscope was advanced into the urinary bladder. It was easily advanced into the right ureteral orifice. The stone was visible approximately 2 cm within the distal ureter. The guidewire was able to be navigated under direct vision around the stone and into the upper pole collecting system without further difficulty. The ureteroscope was removed with the guidewire left in place. The ureteroscope was replaced into the distal ureter. The holmium laser fiber was then utilized to fragment the lead 3 mm stone. There appeared to be a second stone just behind the first stone of approximately the same size. The larger 5 mm stone was then encountered. This was pushed back into a more dilated portion of the ureter. It too was fragmented into multiple smaller pieces. A basket was then utilized to remove the fragments. Once the bulk of the fragments were removed, the scope was advanced to the level of the ureteropelvic junction. The proximal and mid ureter was noted to be very dilated with no other abnormalities appreciated. No other stones were noted. The ureter was re-examined upon withdrawal of the scope. The area of stone impaction demonstrated only mild edema. The decision was made not to place a stent. The ureteroscope was removed. The stone fragments were irrigated from the bladder. They were collected to be sent for stone analysis. The guidewire was then advanced into the left ureteral orifice. A 6-French open-ended catheter was then inserted into the distal ureter. A retrograde pyelogram was performed through the catheter. This demonstrated no evidence of dilation or hydronephrosis. No filling defects were appreciated to suggest a  stone within the ureter. The contrast was noted to drain promptly as the open-ended catheter was removed.  There was no evidence of obstruction. The decision was made not to proceed with any further intervention. The cystoscope was replaced back into the urinary bladder. The bladder was drained. The cystoscope was removed. The patient was returned to the supine position and awakened from laryngeal mask airway anesthesia. She was taken to the recovery room in stable condition. There were no problems or complications. The patient tolerated the procedure well.   ____________________________ Denice Bors. Jacqlyn Larsen, MD bsc:cs D: 06/27/2013 14:11:39 ET T: 06/27/2013 15:04:29 ET JOB#: [id number]  cc: Denice Bors. Jacqlyn Larsen, MD, <Dictator> Denice Bors Khaalid Lefkowitz MD ELECTRONICALLY SIGNED 06/29/2013 20:10

## 2014-07-11 ENCOUNTER — Ambulatory Visit
Admit: 2014-07-11 | Disposition: A | Discharge status: Home / Self Care | Payer: Self-pay | Attending: General Surgery | Admitting: General Surgery

## 2014-07-26 ENCOUNTER — Ambulatory Visit (INDEPENDENT_AMBULATORY_CARE_PROVIDER_SITE_OTHER): Payer: No Typology Code available for payment source | Admitting: General Surgery

## 2014-07-26 ENCOUNTER — Encounter: Payer: Self-pay | Admitting: General Surgery

## 2014-07-26 VITALS — BP 134/68 | HR 74 | Resp 14 | Ht 70.0 in | Wt 286.0 lb

## 2014-07-26 DIAGNOSIS — Z803 Family history of malignant neoplasm of breast: Secondary | ICD-10-CM | POA: Diagnosis not present

## 2014-07-26 DIAGNOSIS — I868 Varicose veins of other specified sites: Secondary | ICD-10-CM | POA: Diagnosis not present

## 2014-07-26 DIAGNOSIS — Z1239 Encounter for other screening for malignant neoplasm of breast: Secondary | ICD-10-CM

## 2014-07-26 DIAGNOSIS — I8390 Asymptomatic varicose veins of unspecified lower extremity: Secondary | ICD-10-CM

## 2014-07-26 NOTE — Patient Instructions (Addendum)
Patient will be asked to return to the office in one year with a bilateral screening mammogram. Continue monthly self breast exams. Call for any problems or changes.

## 2014-07-26 NOTE — Progress Notes (Signed)
Patient ID: Maureen Brown, female   DOB: 1951-02-07, 64 y.o.   MRN: [id number]  Chief Complaint  Patient presents with  . Follow-up    mammogram and varicose veins    HPI Maureen Brown is a 64 y.o. female who presents for a breast evaluation. The most recent mammogram was done on 06/2714. She is also seen for varicose veins with prior right femoral ablation. Legs are doing well, wears stockings regularly.  Patient does perform regular self breast checks and gets regular mammograms done.   Recent diagnosis of Celiac disease.  Now on Gluten Free diet.   HPI  Past Medical History  Diagnosis Date  . Asthma 1989  . Cystitis, unspecified   . Varicose veins of lower extremities with other complications   . GERD (gastroesophageal reflux disease)   . Kidney stone 2014  . Cancer 1989    thyroid    Past Surgical History  Procedure Laterality Date  . Colonoscopy  2009    Dr. Vira Agar  . Breast biopsy Left 1973  . Knee surgery Right 1989  . Abdominal hysterectomy  1983  . Thyroid surgery  1988  . Cholecystectomy  2002  . Tonsillectomy    . Upper gi endoscopy  2013  . Throat surgery  2012    vocal cord surgery  . Kidney stone surgery  2014  . Kidney stone surgery  06/27/13    Dr Jacqlyn Larsen  . Ablation saphenous vein w/ rfa Left 09/2012    GSV    Family History  Problem Relation Age of Onset  . Cancer Maternal Grandmother     breast    Social History History  Substance Use Topics  . Smoking status: Never Smoker   . Smokeless tobacco: Never Used  . Alcohol Use: Yes    Allergies  Allergen Reactions  . Codeine Nausea Only    dizziness  . Sulfa Antibiotics Hives  . Demerol [Meperidine] Rash    Throat itching  . Morphine And Related Rash    Current Outpatient Prescriptions  Medication Sig Dispense Refill  . budesonide-formoterol (SYMBICORT) 80-4.5 MCG/ACT inhaler Inhale 2 puffs into the lungs 2 (two) times daily.    . calcitRIOL (ROCALTROL) 0.5 MCG capsule Take 0.5 mcg  by mouth 2 (two) times daily.    . Cholecalciferol (VITAMIN D) 1000 UNITS capsule Take 1,000 Units by mouth daily.    . dapsone 25 MG tablet     . furosemide (LASIX) 20 MG tablet Take 20 mg by mouth as needed.    . levalbuterol (XOPENEX HFA) 45 MCG/ACT inhaler Inhale 1-2 puffs into the lungs 2 (two) times daily as needed for wheezing.    Marland Kitchen levothyroxine (SYNTHROID, LEVOTHROID) 125 MCG tablet Take 125 mcg by mouth daily.    . naproxen (NAPROSYN) 500 MG tablet Take 500 mg by mouth as needed.     . nystatin-triamcinolone (MYCOLOG II) cream     . potassium citrate (UROCIT-K) 10 MEQ (1080 MG) SR tablet Take 10 mEq by mouth.     Marland Kitchen tiZANidine (ZANAFLEX) 4 MG tablet Take 4 mg by mouth once.     . valACYclovir (VALTREX) 1000 MG tablet Take 1,000 mg by mouth 2 (two) times daily as needed.     . VENTOLIN HFA 108 (90 BASE) MCG/ACT inhaler      No current facility-administered medications for this visit.    Review of Systems Review of Systems  Constitutional: Negative.   Respiratory: Negative.   Cardiovascular: Negative.  Blood pressure 134/68, pulse 74, resp. rate 14, height 5\' 10"  (1.778 m), weight 129.729 kg (286 lb).  Physical Exam Physical Exam  Constitutional: She is oriented to person, place, and time. She appears well-developed and well-nourished.  Eyes: Conjunctivae are normal. No scleral icterus.  Neck: Neck supple.  Cardiovascular: Normal rate, regular rhythm and normal heart sounds.   Pulses:      Dorsalis pedis pulses are 2+ on the right side, and 2+ on the left side.       Posterior tibial pulses are 2+ on the right side, and 2+ on the left side.  Some residual varicose veins noted. No edema noted, no stasis changes.   Pulmonary/Chest: Effort normal and breath sounds normal. Right breast exhibits no inverted nipple, no mass, no nipple discharge, no skin change and no tenderness. Left breast exhibits no inverted nipple, no mass, no nipple discharge, no skin change and no  tenderness.  Lymphadenopathy:    She has no cervical adenopathy.    She has no axillary adenopathy.  Neurological: She is alert and oriented to person, place, and time.  Skin: Skin is warm and dry.    Data Reviewed Mammogram - stable  Assessment    Stable physical exam. Remote family history of breast cancer. History of varicose veins.      Plan    Patient will be asked to return to the office in one year with a bilateral screening mammogram.     PCP:  Maureen Brown 07/26/2014, 9:36 AM

## 2014-12-29 ENCOUNTER — Ambulatory Visit
Admission: EM | Admit: 2014-12-29 | Discharge: 2014-12-29 | Disposition: A | Discharge status: Home / Self Care | Payer: No Typology Code available for payment source | Attending: Family Medicine | Admitting: Family Medicine

## 2014-12-29 DIAGNOSIS — R590 Localized enlarged lymph nodes: Secondary | ICD-10-CM | POA: Diagnosis not present

## 2014-12-29 DIAGNOSIS — J0100 Acute maxillary sinusitis, unspecified: Secondary | ICD-10-CM | POA: Diagnosis not present

## 2014-12-29 MED ORDER — LEVOFLOXACIN 500 MG PO TABS: 500.0000 mg | ORAL_TABLET | Freq: Every day | ORAL | Status: DC

## 2014-12-29 NOTE — ED Provider Notes (Signed)
CSN: [id number]     Arrival date & time 12/29/14  3559 History   First MD Initiated Contact with Patient 12/29/14 1001     Chief Complaint  Patient presents with  . Facial Swelling    Pt with sinus infection 2 weeks ago. Now left ear feels full, and woke up with left facial swelling. Pain 2/10   (Consider location/radiation/quality/duration/timing/severity/associated sxs/prior Treatment) HPI Comments: 64 yo female presents with a 1 week h/o sinus pressure, sinus headache, sinus congestion, and left upper neck swelling. Denies any trouble breathing or swallowing.   The history is provided by the patient.    Past Medical History  Diagnosis Date  . Asthma 1989  . Cystitis, unspecified   . Varicose veins of lower extremities with other complications   . GERD (gastroesophageal reflux disease)   . Kidney stone 2014  . Cancer 1989    thyroid   Past Surgical History  Procedure Laterality Date  . Colonoscopy  2009    Dr. Vira Agar  . Breast biopsy Left 1973  . Knee surgery Right 1989  . Abdominal hysterectomy  1983  . Thyroid surgery  1988  . Cholecystectomy  2002  . Tonsillectomy    . Upper gi endoscopy  2013  . Throat surgery  2012    vocal cord surgery  . Kidney stone surgery  2014  . Kidney stone surgery  06/27/13    Dr Jacqlyn Larsen  . Ablation saphenous vein w/ rfa Left 09/2012    GSV   Family History  Problem Relation Age of Onset  . Cancer Maternal Grandmother     breast   Social History  Substance Use Topics  . Smoking status: Never Smoker   . Smokeless tobacco: Never Used  . Alcohol Use: Yes   OB History    Gravida Para Term Preterm AB TAB SAB Ectopic Multiple Living   3    1  1   2       Obstetric Comments   LMP-1988-hysterectomy First pregnancy-age 40 First menstruation-age 37     Review of Systems  Allergies  Codeine; Sulfa antibiotics; Demerol; Keflex; and Morphine and related  Home Medications   Prior to Admission medications   Medication Sig Start  Date End Date Taking? Authorizing Provider  folic acid (FOLVITE) 1 MG tablet Take 1 mg by mouth daily.   Yes Historical Provider, MD  budesonide-formoterol (SYMBICORT) 80-4.5 MCG/ACT inhaler Inhale 2 puffs into the lungs 2 (two) times daily.    Historical Provider, MD  calcitRIOL (ROCALTROL) 0.5 MCG capsule Take 0.5 mcg by mouth 2 (two) times daily.    Historical Provider, MD  Cholecalciferol (VITAMIN D) 1000 UNITS capsule Take 1,000 Units by mouth daily.    Historical Provider, MD  dapsone 25 MG tablet  05/01/14   Historical Provider, MD  furosemide (LASIX) 20 MG tablet Take 20 mg by mouth as needed.    Historical Provider, MD  levalbuterol [facility] HFA) 45 MCG/ACT inhaler Inhale 1-2 puffs into the lungs 2 (two) times daily as needed for wheezing.    Historical Provider, MD  levofloxacin (LEVAQUIN) 500 MG tablet Take 1 tablet (500 mg total) by mouth daily. 12/29/14   Norval Gable, MD  levothyroxine (SYNTHROID, LEVOTHROID) 125 MCG tablet Take 125 mcg by mouth daily.    Historical Provider, MD  naproxen (NAPROSYN) 500 MG tablet Take 500 mg by mouth as needed.     Historical Provider, MD  nystatin-triamcinolone Lilyan Gilford II) cream  06/21/13   Historical Provider, MD  potassium citrate (UROCIT-K) 10 MEQ (1080 MG) SR tablet Take 10 mEq by mouth.  07/14/13   Historical Provider, MD  tiZANidine (ZANAFLEX) 4 MG tablet Take 4 mg by mouth once.  06/27/13   Historical Provider, MD  valACYclovir (VALTREX) 1000 MG tablet Take 1,000 mg by mouth 2 (two) times daily as needed.     Historical Provider, MD  VENTOLIN HFA 108 (90 BASE) MCG/ACT inhaler  07/24/14   Historical Provider, MD   Meds Ordered and Administered this Visit  Medications - No data to display  BP 142/81 mmHg  Pulse 72  Temp(Src) 98 F (36.7 C) (Oral)  Resp 20  Ht 5\' 10"  (1.778 m)  Wt 282 lb (127.914 kg)  BMI 40.46 kg/m2  SpO2 99% No data found.   Physical Exam  Constitutional: She appears well-developed and well-nourished. No distress.   HENT:  Head: Normocephalic and atraumatic.  Right Ear: Tympanic membrane, external ear and ear canal normal.  Left Ear: Tympanic membrane, external ear and ear canal normal.  Nose: Mucosal edema and rhinorrhea present. No nose lacerations, sinus tenderness, nasal deformity, septal deviation or nasal septal hematoma. No epistaxis.  No foreign bodies. Right sinus exhibits maxillary sinus tenderness and frontal sinus tenderness. Left sinus exhibits maxillary sinus tenderness and frontal sinus tenderness.  Mouth/Throat: Uvula is midline, oropharynx is clear and moist and mucous membranes are normal. No oropharyngeal exudate, posterior oropharyngeal edema, posterior oropharyngeal erythema or tonsillar abscesses.  Eyes: Conjunctivae and EOM are normal. Pupils are equal, round, and reactive to light. Right eye exhibits no discharge. Left eye exhibits no discharge. No scleral icterus.  Neck: Normal range of motion. Neck supple. No thyromegaly present.  Cardiovascular: Normal rate, regular rhythm and normal heart sounds.   Pulmonary/Chest: Effort normal and breath sounds normal. No respiratory distress. She has no wheezes. She has no rales.  Lymphadenopathy:    She has cervical adenopathy (left anterior).  Skin: She is not diaphoretic.  Nursing note and vitals reviewed.   ED Course  Procedures (including critical care time)  Labs Review Labs Reviewed - No data to display  Imaging Review No results found.   Visual Acuity Review  Right Eye Distance:   Left Eye Distance:   Bilateral Distance:    Right Eye Near:   Left Eye Near:    Bilateral Near:         MDM   1. Acute maxillary sinusitis, recurrence not specified   2. Lymphadenopathy, cervical    Discharge Medication List as of 12/29/2014 10:27 AM    START taking these medications   Details  levofloxacin (LEVAQUIN) 500 MG tablet Take 1 tablet (500 mg total) by mouth daily., Starting 12/29/2014, Until Discontinued, Normal       1. diagnosis reviewed with patient 2. rx as per orders above; reviewed possible side effects, interactions, risks and benefits  3. Recommend supportive treatment with otc analgesics prn; flonase nasal spray  4. Follow-up prn if symptoms worsen or don't resolve   Norval Gable, MD 01/04/15 1406

## 2015-01-16 DIAGNOSIS — E538 Deficiency of other specified B group vitamins: Secondary | ICD-10-CM | POA: Insufficient documentation

## 2015-04-16 ENCOUNTER — Other Ambulatory Visit: Payer: Self-pay | Admitting: *Deleted

## 2015-04-16 DIAGNOSIS — Z1239 Encounter for other screening for malignant neoplasm of breast: Secondary | ICD-10-CM

## 2015-04-24 ENCOUNTER — Encounter: Payer: Self-pay | Admitting: *Deleted

## 2015-07-12 ENCOUNTER — Ambulatory Visit
Admission: RE | Admit: 2015-07-12 | Discharge: 2015-07-12 | Disposition: A | Discharge status: Home / Self Care | Payer: BLUE CROSS/BLUE SHIELD | Source: Ambulatory Visit | Attending: General Surgery | Admitting: General Surgery

## 2015-07-12 DIAGNOSIS — Z1231 Encounter for screening mammogram for malignant neoplasm of breast: Secondary | ICD-10-CM | POA: Insufficient documentation

## 2015-07-12 DIAGNOSIS — Z1239 Encounter for other screening for malignant neoplasm of breast: Secondary | ICD-10-CM

## 2015-07-17 ENCOUNTER — Encounter: Payer: Self-pay | Admitting: General Surgery

## 2015-07-17 ENCOUNTER — Ambulatory Visit (INDEPENDENT_AMBULATORY_CARE_PROVIDER_SITE_OTHER): Payer: BLUE CROSS/BLUE SHIELD | Admitting: General Surgery

## 2015-07-17 VITALS — BP 120/78 | HR 72 | Resp 13 | Ht 70.0 in | Wt 294.0 lb

## 2015-07-17 DIAGNOSIS — Z1239 Encounter for other screening for malignant neoplasm of breast: Secondary | ICD-10-CM | POA: Diagnosis not present

## 2015-07-17 DIAGNOSIS — I868 Varicose veins of other specified sites: Secondary | ICD-10-CM

## 2015-07-17 DIAGNOSIS — Z803 Family history of malignant neoplasm of breast: Secondary | ICD-10-CM

## 2015-07-17 DIAGNOSIS — I8390 Asymptomatic varicose veins of unspecified lower extremity: Secondary | ICD-10-CM

## 2015-07-17 NOTE — Progress Notes (Signed)
Patient ID: Maureen Brown, female   DOB: 08-08-1950, 65 y.o.   MRN: [id number]  Chief Complaint  Patient presents with  . Follow-up    mammogram    HPI Maureen Brown is a 65 y.o. female.  who presents for a breast evaluation. The most recent mammogram was done on 07-12-15.  Patient does perform regular self breast checks and gets regular mammograms done.  No new breast complaints.  She is also seen for varicose veins with prior right saphenous ablation. She does states she has one area on the left lower ankle that is tender at times. I have reviewed the history of present illness with the patient.    HPI  Past Medical History  Diagnosis Date  . Asthma 1989  . Cystitis, unspecified   . Varicose veins of lower extremities with other complications   . GERD (gastroesophageal reflux disease)   . Kidney stone 2014  . Cancer [facility]) 1989    thyroid    Past Surgical History  Procedure Laterality Date  . Colonoscopy  2009    Dr. Vira Agar  . Knee surgery Right 1989  . Abdominal hysterectomy  1983  . Thyroid surgery  1988  . Cholecystectomy  2002  . Tonsillectomy    . Upper gi endoscopy  2013  . Throat surgery  2012    vocal cord surgery  . Kidney stone surgery  2014  . Kidney stone surgery  06/27/13    Dr Jacqlyn Larsen  . Ablation saphenous vein w/ rfa Left 09/2012    GSV  . Breast biopsy Left 1973    neg    Family History  Problem Relation Age of Onset  . Breast cancer Maternal Grandmother   . Breast cancer Maternal Aunt     Social History Social History  Substance Use Topics  . Smoking status: Never Smoker   . Smokeless tobacco: Never Used  . Alcohol Use: Yes    Allergies  Allergen Reactions  . Codeine Nausea Only    dizziness  . Sulfa Antibiotics Hives  . Demerol [Meperidine] Rash    Throat itching  . Keflex [Cephalexin] Rash  . Morphine And Related Rash    Current Outpatient Prescriptions  Medication Sig Dispense Refill  . budesonide-formoterol (SYMBICORT)  80-4.5 MCG/ACT inhaler Inhale 2 puffs into the lungs 2 (two) times daily.    . calcitRIOL (ROCALTROL) 0.5 MCG capsule Take 0.5 mcg by mouth 2 (two) times daily.    . Cholecalciferol (VITAMIN D) 1000 UNITS capsule Take 1,000 Units by mouth daily.    . dapsone 25 MG tablet     . folic acid (FOLVITE) 1 MG tablet Take 1 mg by mouth daily.    Marland Kitchen levalbuterol (XOPENEX HFA) 45 MCG/ACT inhaler Inhale 1-2 puffs into the lungs 2 (two) times daily as needed for wheezing.    Marland Kitchen levothyroxine (SYNTHROID, LEVOTHROID) 125 MCG tablet Take 125 mcg by mouth daily.    . naproxen (NAPROSYN) 500 MG tablet Take 500 mg by mouth as needed.     . potassium citrate (UROCIT-K) 10 MEQ (1080 MG) SR tablet Take 10 mEq by mouth.     Marland Kitchen tiZANidine (ZANAFLEX) 4 MG tablet Take 4 mg by mouth once.     . valACYclovir (VALTREX) 1000 MG tablet Take 1,000 mg by mouth 2 (two) times daily as needed.     . VENTOLIN HFA 108 (90 BASE) MCG/ACT inhaler      No current facility-administered medications for this visit.  Review of Systems Review of Systems  Constitutional: Negative.   Respiratory: Negative.   Cardiovascular: Negative.     Blood pressure 120/78, pulse 72, resp. rate 13, height 5\' 10"  (1.778 m), weight 294 lb (133.358 kg).  Physical Exam Physical Exam  Constitutional: She is oriented to person, place, and time. She appears well-developed and well-nourished.  HENT:  Mouth/Throat: Oropharynx is clear and moist.  Eyes: Conjunctivae are normal. No scleral icterus.  Neck: Neck supple.  Cardiovascular: Normal rate, regular rhythm and normal heart sounds.   Pulses:      Dorsalis pedis pulses are 2+ on the right side, and 2+ on the left side.       Posterior tibial pulses are 2+ on the right side, and 2+ on the left side.  No leg edema. Few very small scattered residual vv.  Pulmonary/Chest: Effort normal and breath sounds normal. Right breast exhibits no inverted nipple, no mass, no nipple discharge, no skin change and  no tenderness. Left breast exhibits no inverted nipple, no mass, no nipple discharge, no skin change and no tenderness.  Abdominal: Soft. Normal appearance.  Lymphadenopathy:    She has no cervical adenopathy.    She has no axillary adenopathy.  Neurological: She is alert and oriented to person, place, and time.  Skin: Skin is warm and dry.  Psychiatric: Her behavior is normal.    Data Reviewed Mammogram reviewed and stable.  Assessment    Stable physical exam. Remote family history of breast cancer. History of varicose veins.       Plan    Patient will be asked to return to the office in one year with a bilateral screening mammogram. Continue to wear compression hose.     PCP:  Ramonita Lab  This information has been scribed by Karie Fetch RN, BSN,BC.   SANKAR,SEEPLAPUTHUR G 07/17/2015, 9:37 AM

## 2015-07-17 NOTE — Patient Instructions (Addendum)
The patient is aware to call back for any questions or concerns. Continue self breast exams. Call office for any new breast issues or concerns. Patient will be asked to return to the office in one year with a bilateral screening mammogram Continue to wear compression hose

## 2015-09-27 ENCOUNTER — Encounter: Payer: Self-pay | Admitting: *Deleted

## 2015-09-30 ENCOUNTER — Ambulatory Visit
Admission: RE | Admit: 2015-09-30 | Discharge: 2015-09-30 | Disposition: A | Discharge status: Home / Self Care | Payer: BLUE CROSS/BLUE SHIELD | Source: Ambulatory Visit | Attending: Unknown Physician Specialty | Admitting: Unknown Physician Specialty

## 2015-09-30 ENCOUNTER — Ambulatory Visit: Payer: BLUE CROSS/BLUE SHIELD | Admitting: Anesthesiology

## 2015-09-30 ENCOUNTER — Encounter
Admission: RE | Disposition: A | Discharge status: Home / Self Care | Payer: Self-pay | Source: Ambulatory Visit | Attending: Unknown Physician Specialty

## 2015-09-30 ENCOUNTER — Encounter: Payer: Self-pay | Admitting: Anesthesiology

## 2015-09-30 DIAGNOSIS — E039 Hypothyroidism, unspecified: Secondary | ICD-10-CM | POA: Diagnosis not present

## 2015-09-30 DIAGNOSIS — Z1211 Encounter for screening for malignant neoplasm of colon: Secondary | ICD-10-CM | POA: Insufficient documentation

## 2015-09-30 DIAGNOSIS — D123 Benign neoplasm of transverse colon: Secondary | ICD-10-CM | POA: Diagnosis not present

## 2015-09-30 DIAGNOSIS — F419 Anxiety disorder, unspecified: Secondary | ICD-10-CM | POA: Diagnosis not present

## 2015-09-30 DIAGNOSIS — D120 Benign neoplasm of cecum: Secondary | ICD-10-CM | POA: Diagnosis not present

## 2015-09-30 DIAGNOSIS — Z881 Allergy status to other antibiotic agents status: Secondary | ICD-10-CM | POA: Insufficient documentation

## 2015-09-30 DIAGNOSIS — D124 Benign neoplasm of descending colon: Secondary | ICD-10-CM | POA: Diagnosis not present

## 2015-09-30 DIAGNOSIS — Z85850 Personal history of malignant neoplasm of thyroid: Secondary | ICD-10-CM | POA: Insufficient documentation

## 2015-09-30 DIAGNOSIS — Z6841 Body Mass Index (BMI) 40.0 and over, adult: Secondary | ICD-10-CM | POA: Insufficient documentation

## 2015-09-30 DIAGNOSIS — G4700 Insomnia, unspecified: Secondary | ICD-10-CM | POA: Diagnosis not present

## 2015-09-30 DIAGNOSIS — Z79899 Other long term (current) drug therapy: Secondary | ICD-10-CM | POA: Diagnosis not present

## 2015-09-30 DIAGNOSIS — E892 Postprocedural hypoparathyroidism: Secondary | ICD-10-CM | POA: Insufficient documentation

## 2015-09-30 DIAGNOSIS — J45909 Unspecified asthma, uncomplicated: Secondary | ICD-10-CM | POA: Insufficient documentation

## 2015-09-30 DIAGNOSIS — M1990 Unspecified osteoarthritis, unspecified site: Secondary | ICD-10-CM | POA: Diagnosis not present

## 2015-09-30 DIAGNOSIS — Z882 Allergy status to sulfonamides status: Secondary | ICD-10-CM | POA: Diagnosis not present

## 2015-09-30 DIAGNOSIS — Z9049 Acquired absence of other specified parts of digestive tract: Secondary | ICD-10-CM | POA: Insufficient documentation

## 2015-09-30 DIAGNOSIS — Z885 Allergy status to narcotic agent status: Secondary | ICD-10-CM | POA: Insufficient documentation

## 2015-09-30 DIAGNOSIS — Z90710 Acquired absence of both cervix and uterus: Secondary | ICD-10-CM | POA: Diagnosis not present

## 2015-09-30 DIAGNOSIS — K640 First degree hemorrhoids: Secondary | ICD-10-CM | POA: Diagnosis not present

## 2015-09-30 DIAGNOSIS — D125 Benign neoplasm of sigmoid colon: Secondary | ICD-10-CM | POA: Diagnosis not present

## 2015-09-30 DIAGNOSIS — K219 Gastro-esophageal reflux disease without esophagitis: Secondary | ICD-10-CM | POA: Diagnosis not present

## 2015-09-30 HISTORY — DX: Anemia, unspecified: D64.9

## 2015-09-30 HISTORY — DX: Deficiency of other specified B group vitamins: E53.8

## 2015-09-30 HISTORY — DX: Localized edema: R60.0

## 2015-09-30 HISTORY — DX: Chest pain, unspecified: R07.9

## 2015-09-30 HISTORY — PX: COLONOSCOPY WITH PROPOFOL: SHX5780

## 2015-09-30 HISTORY — DX: Postprocedural hypoparathyroidism: E89.2

## 2015-09-30 HISTORY — DX: Herpesviral vesicular dermatitis: B00.1

## 2015-09-30 HISTORY — DX: Insomnia, unspecified: G47.00

## 2015-09-30 HISTORY — DX: Hypothyroidism, unspecified: E03.9

## 2015-09-30 HISTORY — DX: Unspecified osteoarthritis, unspecified site: M19.90

## 2015-09-30 HISTORY — DX: Decreased white blood cell count, unspecified: D72.819

## 2015-09-30 HISTORY — DX: Pleural effusion, not elsewhere classified: J90

## 2015-09-30 HISTORY — DX: Stress incontinence (female) (male): N39.3

## 2015-09-30 HISTORY — DX: Anxiety disorder, unspecified: F41.9

## 2015-09-30 HISTORY — DX: Adjustment disorder with depressed mood: F43.21

## 2015-09-30 SURGERY — COLONOSCOPY WITH PROPOFOL
Anesthesia: General

## 2015-09-30 MED ORDER — PROPOFOL 10 MG/ML IV BOLUS
INTRAVENOUS | Status: DC | PRN
  Administered 2015-09-30: 40 mg via INTRAVENOUS
  Administered 2015-09-30 (×2): 20 mg via INTRAVENOUS
  Administered 2015-09-30: 30 mg via INTRAVENOUS
  Administered 2015-09-30 (×4): 20 mg via INTRAVENOUS
  Administered 2015-09-30: 30 mg via INTRAVENOUS
  Administered 2015-09-30: 20 mg via INTRAVENOUS
  Administered 2015-09-30: 40 mg via INTRAVENOUS

## 2015-09-30 MED ORDER — PROPOFOL 500 MG/50ML IV EMUL
INTRAVENOUS | Status: DC | PRN
  Administered 2015-09-30: 150 ug/kg/min via INTRAVENOUS

## 2015-09-30 MED ORDER — SODIUM CHLORIDE 0.9 % IV SOLN: INTRAVENOUS | Status: DC

## 2015-09-30 MED ORDER — SODIUM CHLORIDE 0.9 % IV SOLN
INTRAVENOUS | Status: DC
  Administered 2015-09-30: 1000 mL via INTRAVENOUS
  Administered 2015-09-30: 11:00:00 via INTRAVENOUS

## 2015-09-30 NOTE — Anesthesia Postprocedure Evaluation (Signed)
Anesthesia Post Note  Patient: Maureen Brown  Procedure(s) Performed: Procedure(s) (LRB): COLONOSCOPY WITH PROPOFOL (N/A)  Patient location during evaluation: Endoscopy Anesthesia Type: General Level of consciousness: awake and alert Pain management: pain level controlled Vital Signs Assessment: post-procedure vital signs reviewed and stable Respiratory status: spontaneous breathing, nonlabored ventilation, respiratory function stable and patient connected to nasal cannula oxygen Cardiovascular status: blood pressure returned to baseline and stable Postop Assessment: no signs of nausea or vomiting Anesthetic complications: no    Last Vitals:  Filed Vitals:   09/30/15 1118 09/30/15 1150  BP: 120/65 131/79  Pulse: 65   Temp: 36.3 C   Resp: 14     Last Pain: There were no vitals filed for this visit.               Martha Clan

## 2015-09-30 NOTE — H&P (Signed)
Primary Care Physician:  Adin Hector, MD Primary Gastroenterologist:  Dr. Vira Agar  Pre-Procedure History & Physical: HPI:  Maureen Brown is a 65 y.o. female is here for an colonoscopy.   Past Medical History  Diagnosis Date  . Asthma 1989  . Cystitis, unspecified   . Varicose veins of lower extremities with other complications   . GERD (gastroesophageal reflux disease)   . Kidney stone 2014  . Anxiety   . Vitamin B 12 deficiency   . Chest pain   . Grief reaction 2008  . Herpes labialis   . Anemia   . Hypothyroidism   . Insomnia   . Kidney stone   . Leg edema   . Leukopenia   . Cancer ([place]) 1989    thyroid  . Arthritis   . Pleural effusion   . 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 Stress incontinence     Past Surgical History  Procedure Laterality Date  . Colonoscopy  2009    Dr. Vira Agar  . Knee surgery Right 1989  . Abdominal hysterectomy  1983  . Thyroid surgery  1988  . Cholecystectomy  2002  . Tonsillectomy    . Upper gi endoscopy  2013  . Throat surgery  2012    vocal cord surgery  . Kidney stone surgery  2014  . Kidney stone surgery  06/27/13    Dr Jacqlyn Larsen  . Ablation saphenous vein w/ rfa Left 09/2012    GSV  . Breast biopsy Left 1973    neg  . Breast cyst removal      Prior to Admission medications   Medication Sig Start Date End Date Taking? Authorizing Provider  ALPRAZolam Duanne Moron) 0.25 MG tablet Take 0.25 mg by mouth at bedtime as needed for anxiety.   Yes Historical Provider, MD  budesonide-formoterol (SYMBICORT) 80-4.5 MCG/ACT inhaler Inhale 2 puffs into the lungs 2 (two) times daily.   Yes Historical Provider, MD  calcitRIOL (ROCALTROL) 0.5 MCG capsule Take 0.5 mcg by mouth 2 (two) times daily.   Yes Historical Provider, MD  Cholecalciferol (VITAMIN D) 1000 UNITS capsule Take 1,000 Units by mouth daily.   Yes Historical Provider, MD  Cyanocobalamin (VITAMIN B 12 PO) Take 1,000 mcg by mouth daily.   Yes Historical Provider, MD  dapsone  25 MG tablet  05/01/14  Yes Historical Provider, MD  folic acid (FOLVITE) 1 MG tablet Take 1 mg by mouth daily.   Yes Historical Provider, MD  levalbuterol [facility] HFA) 45 MCG/ACT inhaler Inhale 1-2 puffs into the lungs 2 (two) times daily as needed for wheezing.   Yes Historical Provider, MD  levothyroxine (SYNTHROID, LEVOTHROID) 125 MCG tablet Take 125 mcg by mouth daily.   Yes Historical Provider, MD  naproxen (NAPROSYN) 500 MG tablet Take 500 mg by mouth as needed.    Yes Historical Provider, MD  potassium citrate (UROCIT-K) 10 MEQ (1080 MG) SR tablet Take 10 mEq by mouth.  07/14/13  Yes Historical Provider, MD  pyridoxine (B-6) 100 MG tablet Take 100 mg by mouth daily.   Yes Historical Provider, MD  VENTOLIN HFA 108 (90 BASE) MCG/ACT inhaler  07/24/14  Yes Historical Provider, MD  tiZANidine (ZANAFLEX) 4 MG tablet Take 4 mg by mouth once. Reported on 09/30/2015 06/27/13   Historical Provider, MD  valACYclovir (VALTREX) 1000 MG tablet Take 1,000 mg by mouth 2 (two) times daily as needed. Reported on 09/30/2015    Historical Provider, MD    Allergies as of 09/02/2015 -  Review Complete 07/17/2015  Allergen Reaction Noted  . Codeine Nausea Only 05/07/2012  . Sulfa antibiotics Hives 05/07/2012  . Demerol [meperidine] Rash 05/07/2012  . Keflex [cephalexin] Rash 12/29/2014  . Morphine and related Rash 05/07/2012    Family History  Problem Relation Age of Onset  . Breast cancer Maternal Grandmother   . Breast cancer Maternal Aunt     Social History   Social History  . Marital Status: Married    Spouse Name: N/A  . Number of Children: N/A  . Years of Education: N/A   Occupational History  . Not on file.   Social History Main Topics  . Smoking status: Never Smoker   . Smokeless tobacco: Never Used  . Alcohol Use: Yes  . Drug Use: No  . Sexual Activity: Not on file   Other Topics Concern  . Not on file   Social History Narrative    Review of Systems: See HPI, otherwise  negative ROS  Physical Exam: BP 156/90 mmHg  Pulse 69  Temp(Src) 99.2 F (37.3 C) (Tympanic)  Resp 16  Ht 5\' 10"  (1.778 m)  Wt 128.822 kg (284 lb)  BMI 40.75 kg/m2  SpO2 100% General:   Alert,  pleasant and cooperative in NAD Head:  Normocephalic and atraumatic. Neck:  Supple; no masses or thyromegaly. Lungs:  Clear throughout to auscultation.    Heart:  Regular rate and rhythm. Abdomen:  Soft, nontender and nondistended. Normal bowel sounds, without guarding, and without rebound.   Neurologic:  Alert and  oriented x4;  grossly normal neurologically.  Impression/Plan: Maureen Brown is here for an colonoscopy to be performed for screening.  Risks, benefits, limitations, and alternatives regarding  colonoscopy have been reviewed with the patient.  Questions have been answered.  All parties agreeable.   Gaylyn Cheers, MD  09/30/2015, 10:33 AM

## 2015-09-30 NOTE — Transfer of Care (Signed)
Immediate Anesthesia Transfer of Care Note  Patient: Maureen Brown  Procedure(s) Performed: Procedure(s): COLONOSCOPY WITH PROPOFOL (N/A)  Patient Location: PACU  Anesthesia Type:General  Level of Consciousness: awake, alert  and oriented  Airway & Oxygen Therapy: Patient Spontanous Breathing and Patient connected to nasal cannula oxygen  Post-op Assessment: Report given to RN and Post -op Vital signs reviewed and stable  Post vital signs: Reviewed and stable  Last Vitals:  Filed Vitals:   09/30/15 0930 09/30/15 1118  BP: 156/90 120/65  Pulse: 69 65  Temp: 37.3 C 36.3 C  Resp: 16 14    Last Pain: There were no vitals filed for this visit.       Complications: No apparent anesthesia complications

## 2015-09-30 NOTE — Op Note (Signed)
[facility] Gastroenterology Patient Name: Maureen Brown Procedure Date: 09/30/2015 10:41 AM MRN: [id number] Account #: [id number] Date of Birth: Jul 25, 1950 Admit Type: Outpatient Age: 65 Room: [facility] ENDO ROOM 1 Gender: Female Note Status: Finalized Procedure:            Colonoscopy Indications:          Screening for colorectal malignant neoplasm Providers:            Manya Silvas, MD Referring MD:         Ramonita Lab, MD (Referring MD) Medicines:            Propofol per Anesthesia Complications:        No immediate complications. Procedure:            Pre-Anesthesia Assessment:                       - After reviewing the risks and benefits, the patient                        was deemed in satisfactory condition to undergo the                        procedure.                       After obtaining informed consent, the colonoscope was                        passed under direct vision. Throughout the procedure,                        the patient's blood pressure, pulse, and oxygen                        saturations were monitored continuously. The                        Colonoscope was introduced through the anus and                        advanced to the the cecum, identified by appendiceal                        orifice and ileocecal valve. The colonoscopy was                        performed without difficulty. The patient tolerated the                        procedure well. The quality of the bowel preparation                        was good. Findings:      Three sessile polyps were found in the sigmoid colon, descending colon       and transverse colon. The polyps were small in size. These polyps were       removed with a hot snare. Resection and retrieval were complete.      A diminutive polyp was found in the cecum. The polyp was sessile. The       polyp was removed with a cold snare. Resection  and retrieval were       complete.      Internal  hemorrhoids were found during endoscopy. The hemorrhoids were       small and Grade I (internal hemorrhoids that do not prolapse).      The exam was otherwise without abnormality. Impression:           - Three small polyps in the sigmoid colon, in the                        descending colon and in the transverse colon, removed                        with a hot snare. Resected and retrieved.                       - One diminutive polyp in the cecum, removed with a                        cold snare. Resected and retrieved.                       - Internal hemorrhoids.                       - The examination was otherwise normal. Recommendation:       - Await pathology results. Manya Silvas, MD 09/30/2015 11:15:35 AM This report has been signed electronically. Number of Addenda: 0 Note Initiated On: 09/30/2015 10:41 AM Scope Withdrawal Time: 0 hours 12 minutes 26 seconds  Total Procedure Duration: 0 hours 23 minutes 0 seconds       [facility]

## 2015-09-30 NOTE — Anesthesia Preprocedure Evaluation (Signed)
Anesthesia Evaluation  Patient identified by MRN, date of birth, ID band Patient awake    Reviewed: Allergy & Precautions, H&P , NPO status , Patient's Chart, lab work & pertinent test results, reviewed documented beta blocker date and time   History of Anesthesia Complications Negative for: history of anesthetic complications  Airway Mallampati: II  TM Distance: >3 FB Neck ROM: full    Dental no notable dental hx. (+) Missing   Pulmonary neg shortness of breath, asthma , neg sleep apnea, neg COPD, neg recent URI,    Pulmonary exam normal breath sounds clear to auscultation       Cardiovascular Exercise Tolerance: Good negative cardio ROS Normal cardiovascular exam Rhythm:regular Rate:Normal     Neuro/Psych negative neurological ROS  negative psych ROS   GI/Hepatic Neg liver ROS, GERD  ,  Endo/Other  neg diabetesHypothyroidism Morbid obesity  Renal/GU Renal disease (kidney stones)  negative genitourinary   Musculoskeletal   Abdominal   Peds  Hematology  (+) Blood dyscrasia, anemia ,   Anesthesia Other Findings Past Medical History:   Asthma                                          1989         Cystitis, unspecified                                        Varicose veins of lower extremities with other*              GERD (gastroesophageal reflux disease)                       Kidney stone                                    2014         Anxiety                                                      Vitamin B 12 deficiency                                      Chest pain                                                   Grief reaction                                  2008         Herpes labialis  Anemia                                                       Hypothyroidism                                               Insomnia                                                      Kidney stone                                                 Leg edema                                                    Leukopenia                                                   Cancer (HCC)                                    1989           Comment:thyroid   Arthritis                                                    Pleural effusion                                             Postsurgical hypoparathyroidism (HCC)                        Stress incontinence                                          Reproductive/Obstetrics negative OB ROS                             Anesthesia Physical Anesthesia Plan  ASA: III  Anesthesia Plan: General   Post-op Pain Management:    Induction:   Airway Management Planned:   Additional Equipment:   Intra-op Plan:   Post-operative Plan:   Informed  Consent: I have reviewed the patients History and Physical, chart, labs and discussed the procedure including the risks, benefits and alternatives for the proposed anesthesia with the patient or authorized representative who has indicated his/her understanding and acceptance.   Dental Advisory Given  Plan Discussed with: Anesthesiologist, CRNA and Surgeon  Anesthesia Plan Comments:         Anesthesia Quick Evaluation

## 2015-10-01 ENCOUNTER — Encounter: Payer: Self-pay | Admitting: Unknown Physician Specialty

## 2015-10-02 LAB — SURGICAL PATHOLOGY

## 2015-12-26 DIAGNOSIS — D696 Thrombocytopenia, unspecified: Secondary | ICD-10-CM | POA: Insufficient documentation

## 2016-05-15 ENCOUNTER — Other Ambulatory Visit: Payer: Self-pay

## 2016-05-15 DIAGNOSIS — Z1231 Encounter for screening mammogram for malignant neoplasm of breast: Secondary | ICD-10-CM

## 2016-07-14 ENCOUNTER — Encounter: Payer: Self-pay | Admitting: *Deleted

## 2016-07-14 ENCOUNTER — Ambulatory Visit
Admission: RE | Admit: 2016-07-14 | Discharge: 2016-07-14 | Disposition: A | Discharge status: Home / Self Care | Payer: BLUE CROSS/BLUE SHIELD | Source: Ambulatory Visit | Attending: General Surgery | Admitting: General Surgery

## 2016-07-14 DIAGNOSIS — Z1231 Encounter for screening mammogram for malignant neoplasm of breast: Secondary | ICD-10-CM | POA: Insufficient documentation

## 2016-07-21 ENCOUNTER — Ambulatory Visit (INDEPENDENT_AMBULATORY_CARE_PROVIDER_SITE_OTHER): Payer: BLUE CROSS/BLUE SHIELD | Admitting: General Surgery

## 2016-07-21 ENCOUNTER — Encounter: Payer: Self-pay | Admitting: General Surgery

## 2016-07-21 VITALS — BP 140/80 | HR 76 | Resp 14 | Ht 70.0 in | Wt 272.0 lb

## 2016-07-21 DIAGNOSIS — Z85850 Personal history of malignant neoplasm of thyroid: Secondary | ICD-10-CM

## 2016-07-21 DIAGNOSIS — Z1231 Encounter for screening mammogram for malignant neoplasm of breast: Secondary | ICD-10-CM

## 2016-07-21 DIAGNOSIS — Z803 Family history of malignant neoplasm of breast: Secondary | ICD-10-CM

## 2016-07-21 DIAGNOSIS — Z1239 Encounter for other screening for malignant neoplasm of breast: Secondary | ICD-10-CM

## 2016-07-21 NOTE — Progress Notes (Signed)
Patient ID: Maureen Brown, female   DOB: 1950/08/25, 66 y.o.   MRN: [id number]  Chief Complaint  Patient presents with  . Follow-up    mammogram    HPI Maureen Brown is a 66 y.o. female.  who presents for a breast evaluation. The most recent mammogram was done on 07-14-16.  Patient does perform regular self breast checks and gets regular mammograms done.  No new breast issues.   HPI  Past Medical History:  Diagnosis Date  . Anemia   . Anxiety   . Arthritis   . Asthma 1989  . Cancer ([place]) 1989   thyroid  . Chest pain   . Cystitis, unspecified   . GERD (gastroesophageal reflux disease)   . Grief reaction 2008  . Herpes labialis   . Hypothyroidism   . Insomnia   . Kidney stone 2014, 2018  . Leg edema   . Leukopenia   . Pleural effusion   . 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 Stress incontinence   . Varicose veins of lower extremities with other complications   . Vitamin B 12 deficiency     Past Surgical History:  Procedure Laterality Date  . ABDOMINAL HYSTERECTOMY  1983  . ABLATION SAPHENOUS VEIN W/ RFA Left 09/2012   GSV  . BREAST BIOPSY Left 1973   neg  . BREAST CYST REMOVAL    . CHOLECYSTECTOMY  2002  . COLONOSCOPY  2009   Dr. Vira Agar  . COLONOSCOPY WITH PROPOFOL N/A 09/30/2015   Procedure: COLONOSCOPY WITH PROPOFOL;  Surgeon: Manya Silvas, MD;  Location[facility] END[facility]COPY;  Service: Endoscopy;  Laterality: N/A;  . KIDNEY STONE SURGERY  2014  . KIDNEY STONE SURGERY  06/27/13, 07-09-16   Dr Jacqlyn Larsen  . KNEE SURGERY Right 1989  . THROAT SURGERY  2012   vocal cord surgery  . THYROID SURGERY  1988  . TONSILLECTOMY    . UPPER GI ENDOSCOPY  2013    Family History  Problem Relation Age of Onset  . Breast cancer Maternal Grandmother   . Breast cancer Maternal Aunt     Social History Social History  Substance Use Topics  . Smoking status: Never Smoker  . Smokeless tobacco: Never Used  . Alcohol use Yes    Allergies  Allergen Reactions  . Advair  Diskus [Fluticasone-Salmeterol]   . Cephalosporins   . Codeine Nausea Only    dizziness  . Hydrochlorothiazide   . Sulfa Antibiotics Hives  . Ciprofloxacin Rash  . Demerol [Meperidine] Rash    Throat itching  . Keflex [Cephalexin] Rash  . Morphine And Related Rash    Current Outpatient Prescriptions  Medication Sig Dispense Refill  . ALPRAZolam (XANAX) 0.25 MG tablet Take 0.25 mg by mouth at bedtime as needed for anxiety.    . budesonide-formoterol (SYMBICORT) 80-4.5 MCG/ACT inhaler Inhale 2 puffs into the lungs 2 (two) times daily.    . calcitRIOL (ROCALTROL) 0.5 MCG capsule Take 0.5 mcg by mouth 2 (two) times daily.    . Cholecalciferol (VITAMIN D) 1000 UNITS capsule Take 1,000 Units by mouth daily.    . Cyanocobalamin (VITAMIN B 12 PO) Take 1,000 mcg by mouth daily.    . dapsone 25 MG tablet     . folic acid (FOLVITE) 1 MG tablet Take 1 mg by mouth daily.    Marland Kitchen levalbuterol (XOPENEX HFA) 45 MCG/ACT inhaler Inhale 1-2 puffs into the lungs 2 (two) times daily as needed for wheezing.    Marland Kitchen levothyroxine (SYNTHROID,  LEVOTHROID) 125 MCG tablet Take 125 mcg by mouth daily.    . naproxen (NAPROSYN) 500 MG tablet Take 500 mg by mouth as needed.     . potassium citrate (UROCIT-K) 10 MEQ (1080 MG) SR tablet Take 10 mEq by mouth.     . pyridoxine (B-6) 100 MG tablet Take 100 mg by mouth daily.    Marland Kitchen tiZANidine (ZANAFLEX) 4 MG tablet Take 4 mg by mouth once. Reported on 09/30/2015    . valACYclovir (VALTREX) 1000 MG tablet Take 1,000 mg by mouth 2 (two) times daily as needed. Reported on 09/30/2015    . VENTOLIN HFA 108 (90 BASE) MCG/ACT inhaler      No current facility-administered medications for this visit.     Review of Systems Review of Systems  Blood pressure 140/80, pulse 76, resp. rate 14, height 5\' 10"  (1.778 m), weight 272 lb (123.4 kg).  Physical Exam Physical Exam  Constitutional: She is oriented to person, place, and time. She appears well-developed and well-nourished.  HENT:   Mouth/Throat: Oropharynx is clear and moist.  Eyes: Conjunctivae are normal. Pupils are equal, round, and reactive to light. No scleral icterus.  Neck: Neck supple. No thyromegaly present.  Cardiovascular: Normal rate, regular rhythm and normal heart sounds.   Pulmonary/Chest: Effort normal and breath sounds normal. Right breast exhibits no inverted nipple, no mass, no nipple discharge, no skin change and no tenderness. Left breast exhibits no inverted nipple, no mass, no nipple discharge, no skin change and no tenderness.  Abdominal: Soft. Bowel sounds are normal.  Lymphadenopathy:    She has no cervical adenopathy.    She has no axillary adenopathy.  Neurological: She is alert and oriented to person, place, and time.  Skin: Skin is warm and dry.  Psychiatric: Her behavior is normal.    Data Reviewed Mammogram reviewed - stable Prior notes reviewed. Assessment    Stable physical exam. Remote family history of breast cancer. History of thyroid cancer. Colonoscopy done in 2017 with Dr. Tiffany Kocher, next is due in 2020.    Plan    Patient to return to her PCP for yearly mammogram and breast exam.      HPI, Physical Exam, Assessment and Plan have been scribed under the direction and in the presence of Mckinley Jewel, MD  Karie Fetch, RN  I have completed the exam and reviewed the above documentation for accuracy and completeness.  I agree with the above.  Haematologist has been used and any errors in dictation or transcription are unintentional.  Porchea Charrier G. Jamal Collin, M.D., F.A.C.S.  Junie Panning G 07/21/2016, 12:01 PM

## 2016-07-21 NOTE — Patient Instructions (Signed)
Patient to return to her PCP for yearly mammogram and breast exam.

## 2017-06-03 ENCOUNTER — Other Ambulatory Visit: Payer: Self-pay | Admitting: Internal Medicine

## 2017-06-03 DIAGNOSIS — Z1231 Encounter for screening mammogram for malignant neoplasm of breast: Secondary | ICD-10-CM

## 2017-06-22 DIAGNOSIS — R8281 Pyuria: Secondary | ICD-10-CM | POA: Insufficient documentation

## 2017-07-16 ENCOUNTER — Ambulatory Visit
Admission: RE | Admit: 2017-07-16 | Discharge: 2017-07-16 | Disposition: A | Discharge status: Home / Self Care | Payer: BLUE CROSS/BLUE SHIELD | Source: Ambulatory Visit | Attending: Internal Medicine | Admitting: Internal Medicine

## 2017-07-16 DIAGNOSIS — Z1231 Encounter for screening mammogram for malignant neoplasm of breast: Secondary | ICD-10-CM | POA: Insufficient documentation

## 2017-07-20 ENCOUNTER — Other Ambulatory Visit: Payer: Self-pay | Admitting: Internal Medicine

## 2017-07-20 DIAGNOSIS — R928 Other abnormal and inconclusive findings on diagnostic imaging of breast: Secondary | ICD-10-CM

## 2017-07-20 DIAGNOSIS — N6489 Other specified disorders of breast: Secondary | ICD-10-CM

## 2017-07-20 DIAGNOSIS — R921 Mammographic calcification found on diagnostic imaging of breast: Secondary | ICD-10-CM

## 2017-07-28 ENCOUNTER — Other Ambulatory Visit: Payer: Self-pay | Admitting: Internal Medicine

## 2017-07-28 DIAGNOSIS — D61818 Other pancytopenia: Secondary | ICD-10-CM

## 2017-07-28 DIAGNOSIS — R748 Abnormal levels of other serum enzymes: Secondary | ICD-10-CM

## 2017-07-30 ENCOUNTER — Ambulatory Visit
Admission: RE | Admit: 2017-07-30 | Discharge: 2017-07-30 | Disposition: A | Discharge status: Home / Self Care | Payer: BLUE CROSS/BLUE SHIELD | Source: Ambulatory Visit | Attending: Internal Medicine | Admitting: Internal Medicine

## 2017-07-30 DIAGNOSIS — R928 Other abnormal and inconclusive findings on diagnostic imaging of breast: Secondary | ICD-10-CM | POA: Diagnosis present

## 2017-07-30 DIAGNOSIS — N6489 Other specified disorders of breast: Secondary | ICD-10-CM | POA: Diagnosis present

## 2017-07-30 DIAGNOSIS — R921 Mammographic calcification found on diagnostic imaging of breast: Secondary | ICD-10-CM | POA: Diagnosis present

## 2017-08-02 ENCOUNTER — Ambulatory Visit
Admission: RE | Admit: 2017-08-02 | Discharge: 2017-08-02 | Disposition: A | Discharge status: Home / Self Care | Payer: BLUE CROSS/BLUE SHIELD | Source: Ambulatory Visit | Attending: Internal Medicine | Admitting: Internal Medicine

## 2017-08-02 DIAGNOSIS — D61818 Other pancytopenia: Secondary | ICD-10-CM | POA: Diagnosis not present

## 2017-08-02 DIAGNOSIS — R748 Abnormal levels of other serum enzymes: Secondary | ICD-10-CM | POA: Insufficient documentation

## 2017-08-02 DIAGNOSIS — N200 Calculus of kidney: Secondary | ICD-10-CM | POA: Insufficient documentation

## 2017-08-04 ENCOUNTER — Inpatient Hospital Stay: Payer: BLUE CROSS/BLUE SHIELD

## 2017-08-04 ENCOUNTER — Inpatient Hospital Stay: Payer: BLUE CROSS/BLUE SHIELD | Attending: Oncology | Admitting: Oncology

## 2017-08-04 ENCOUNTER — Other Ambulatory Visit: Payer: Self-pay

## 2017-08-04 ENCOUNTER — Encounter: Payer: Self-pay | Admitting: Oncology

## 2017-08-04 VITALS — BP 156/83 | HR 70 | Temp 97.6°F | Resp 18 | Wt 250.1 lb

## 2017-08-04 DIAGNOSIS — D61818 Other pancytopenia: Secondary | ICD-10-CM | POA: Insufficient documentation

## 2017-08-04 DIAGNOSIS — D696 Thrombocytopenia, unspecified: Secondary | ICD-10-CM

## 2017-08-04 DIAGNOSIS — Z85850 Personal history of malignant neoplasm of thyroid: Secondary | ICD-10-CM | POA: Diagnosis not present

## 2017-08-04 DIAGNOSIS — K219 Gastro-esophageal reflux disease without esophagitis: Secondary | ICD-10-CM | POA: Diagnosis not present

## 2017-08-04 DIAGNOSIS — D72819 Decreased white blood cell count, unspecified: Secondary | ICD-10-CM

## 2017-08-04 DIAGNOSIS — E892 Postprocedural hypoparathyroidism: Secondary | ICD-10-CM | POA: Insufficient documentation

## 2017-08-04 DIAGNOSIS — R5382 Chronic fatigue, unspecified: Secondary | ICD-10-CM | POA: Insufficient documentation

## 2017-08-04 DIAGNOSIS — M1990 Unspecified osteoarthritis, unspecified site: Secondary | ICD-10-CM | POA: Insufficient documentation

## 2017-08-04 DIAGNOSIS — R5381 Other malaise: Secondary | ICD-10-CM | POA: Diagnosis not present

## 2017-08-04 DIAGNOSIS — D649 Anemia, unspecified: Secondary | ICD-10-CM

## 2017-08-04 DIAGNOSIS — Z79899 Other long term (current) drug therapy: Secondary | ICD-10-CM | POA: Diagnosis not present

## 2017-08-04 LAB — CBC WITH DIFFERENTIAL/PLATELET
Basophils Absolute: 0 10*3/uL (ref 0–0.1)
Basophils Relative: 0 %
EOS ABS: 0 10*3/uL (ref 0–0.7)
Eosinophils Relative: 0 %
HEMATOCRIT: 37.3 % (ref 35.0–47.0)
HEMOGLOBIN: 12.9 g/dL (ref 12.0–16.0)
LYMPHS ABS: 0.7 10*3/uL — AB (ref 1.0–3.6)
LYMPHS PCT: 18 %
MCH: 36.2 pg — AB (ref 26.0–34.0)
MCHC: 34.7 g/dL (ref 32.0–36.0)
MCV: 104.3 fL — AB (ref 80.0–100.0)
MONOS PCT: 8 %
Monocytes Absolute: 0.3 10*3/uL (ref 0.2–0.9)
NEUTROS ABS: 2.9 10*3/uL (ref 1.4–6.5)
NEUTROS PCT: 74 %
Platelets: 149 10*3/uL — ABNORMAL LOW (ref 150–440)
RBC: 3.57 MIL/uL — AB (ref 3.80–5.20)
RDW: 12.6 % (ref 11.5–14.5)
WBC: 4 10*3/uL (ref 3.6–11.0)

## 2017-08-04 LAB — IRON AND TIBC
IRON: 102 ug/dL (ref 28–170)
Saturation Ratios: 32 % — ABNORMAL HIGH (ref 10.4–31.8)
TIBC: 316 ug/dL (ref 250–450)
UIBC: 214 ug/dL

## 2017-08-04 LAB — COMPREHENSIVE METABOLIC PANEL
ALT: 44 U/L (ref 14–54)
AST: 54 U/L — ABNORMAL HIGH (ref 15–41)
Albumin: 4.1 g/dL (ref 3.5–5.0)
Alkaline Phosphatase: 47 U/L (ref 38–126)
Anion gap: 10 (ref 5–15)
BUN: 24 mg/dL — ABNORMAL HIGH (ref 6–20)
CO2: 25 mmol/L (ref 22–32)
CREATININE: 1.26 mg/dL — AB (ref 0.44–1.00)
Calcium: 10 mg/dL (ref 8.9–10.3)
Chloride: 104 mmol/L (ref 101–111)
GFR calc non Af Amer: 44 mL/min — ABNORMAL LOW (ref >60)
GFR, EST AFRICAN AMERICAN: 51 mL/min — AB (ref >60)
Glucose, Bld: 105 mg/dL — ABNORMAL HIGH (ref 65–99)
POTASSIUM: 3.4 mmol/L — AB (ref 3.5–5.1)
SODIUM: 139 mmol/L (ref 135–145)
Total Bilirubin: 1.4 mg/dL — ABNORMAL HIGH (ref 0.3–1.2)
Total Protein: 7.4 g/dL (ref 6.5–8.1)

## 2017-08-04 LAB — TECHNOLOGIST SMEAR REVIEW

## 2017-08-04 LAB — VITAMIN B12: Vitamin B-12: 977 pg/mL — ABNORMAL HIGH (ref 180–914)

## 2017-08-04 LAB — FERRITIN: FERRITIN: 122 ng/mL (ref 11–307)

## 2017-08-04 LAB — FOLATE: FOLATE: 45 ng/mL (ref >5.9)

## 2017-08-04 LAB — LACTATE DEHYDROGENASE: LDH: 168 U/L (ref 98–192)

## 2017-08-04 NOTE — Progress Notes (Signed)
Pt and husband are in for new patient visit today.

## 2017-08-04 NOTE — Progress Notes (Signed)
Hematology/Oncology Consult note [facility] Telephone:([phone] [phone]   Patient Care Team: Adin Hector, MD as PCP - General (Internal Medicine) Christene Lye, MD (General Surgery)  REFERRING PROVIDER: Adin Hector, MD CHIEF COMPLAINTS/PURPOSE OF CONSULTATION:  Evaluation of pancytopenia.  HISTORY OF PRESENTING ILLNESS:  Maureen Brown is a  67 y.o.  female with PMH listed below who was referred to me for evaluation of pancytopenia.  Patient was referred by Dr. Caryl Comes.  She recently had labs done a April 2019 which showed WBC 3.3, hemoglobin 11.8, MCV 103.8, and platelet count 1 41,000.  Chemistry showed creatinine 1.2, AST 46, ALT is 40, bilirubin 1.2. She has a history of thyroid cancer status post surgery, she takes Synthroid and TS H are being monitored by endocrinologist. Patient reports easy bruising, denies any hemoptysis, epistaxis, hematochezia or hematemesis. Feels chronically tired.  She has a part-time job at [employer] and remains busy doing house chore at home as well.  Lives with husband.  Social alcohol use. Denies any herbal supplementation  She tells me that I know my blood counts always low for years.  Her lab records at [facility] clinic back in 2015 showed WBC 3.9, macrocytosis MCV with 100.0, platelets 1 60,000 History of kidney stone follows up with urology.  She had an ultrasound abdomen complete which showed normal size of spleen, bilateral known obstructive nephrolithiasis.  Review of Systems  Constitutional: Positive for malaise/fatigue. Negative for chills, fever and weight loss.  HENT: Negative for congestion, ear discharge, ear pain, nosebleeds, sinus pain and sore throat.   Eyes: Negative for double vision, photophobia, pain, discharge and redness.  Respiratory: Negative for cough, hemoptysis, sputum production, shortness of breath and wheezing.   Cardiovascular: Negative for chest pain, palpitations,  orthopnea, claudication and leg swelling.  Gastrointestinal: Negative for abdominal pain, blood in stool, constipation, diarrhea, heartburn, melena, nausea and vomiting.  Genitourinary: Negative for dysuria, flank pain, frequency and hematuria.  Musculoskeletal: Negative for back pain, myalgias and neck pain.  Skin: Negative for itching and rash.  Neurological: Negative for dizziness, tingling, tremors, focal weakness, weakness and headaches.  Endo/Heme/Allergies: Negative for environmental allergies. Bruises/bleeds easily.  Psychiatric/Behavioral: Negative for depression and hallucinations. The patient is not nervous/anxious.     MEDICAL HISTORY:  Past Medical History:  Diagnosis Date  . Anemia   . Anxiety   . Arthritis   . Asthma 1989  . Cancer ([place]) 1989   thyroid  . Chest pain   . Cystitis, unspecified   . GERD (gastroesophageal reflux disease)   . Grief reaction 2008  . Herpes labialis   . Hypothyroidism   . Insomnia   . Kidney stone 2014, 2018  . Leg edema   . Leukopenia   . Pleural effusion   . 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 Stress incontinence   . Varicose veins of lower extremities with other complications   . Vitamin B 12 deficiency     SURGICAL HISTORY: Past Surgical History:  Procedure Laterality Date  . ABDOMINAL HYSTERECTOMY  1983  . ABLATION SAPHENOUS VEIN W/ RFA Left 09/2012   [facility]  . BREAST BIOPSY Left 1973   neg  . BREAST CYST REMOVAL    . CHOLECYSTECTOMY  2002  . COLONOSCOPY  2009   Dr. Vira Agar  . COLONOSCOPY WITH PROPOFOL N/A 09/30/2015   Procedure: COLONOSCOPY WITH PROPOFOL;  Surgeon: Manya Silvas, MD;  Location[facility] ENDOSCOPY;  Service: Endoscopy;  Laterality: N/A;  .  KIDNEY STONE SURGERY  2014  . KIDNEY STONE SURGERY  06/27/13, 07-09-16   Dr Jacqlyn Larsen  . KNEE SURGERY Right 1989  . THROAT SURGERY  2012   vocal cord surgery  . THYROID SURGERY  1988  . TONSILLECTOMY    . UPPER GI ENDOSCOPY  2013    SOCIAL HISTORY: Social History     Socioeconomic History  . Marital status: Married    Spouse name: Not on file  . Number of children: Not on file  . Years of education: Not on file  . Highest education level: Not on file  Occupational History  . Not on file  Social Needs  . Financial resource strain: Not on file  . Food insecurity:    Worry: Not on file    Inability: Not on file  . Transportation needs:    Medical: Not on file    Non-medical: Not on file  Tobacco Use  . Smoking status: Never Smoker  . Smokeless tobacco: Never Used  Substance and Sexual Activity  . Alcohol use: Yes  . Drug use: No  . Sexual activity: Not on file  Lifestyle  . Physical activity:    Days per week: Not on file    Minutes per session: Not on file  . Stress: Not on file  Relationships  . Social connections:    Talks on phone: Not on file    Gets together: Not on file    Attends religious service: Not on file    Active member of club or organization: Not on file    Attends meetings of clubs or organizations: Not on file    Relationship status: Not on file  . Intimate partner violence:    Fear of current or ex partner: Not on file    Emotionally abused: Not on file    Physically abused: Not on file    Forced sexual activity: Not on file  Other Topics Concern  . Not on file  Social History Narrative  . Not on file    FAMILY HISTORY: Family History  Problem Relation Age of Onset  . Breast cancer Maternal Grandmother   . Breast cancer Maternal Aunt   . Thyroid cancer Paternal Grandmother     ALLERGIES:  is allergic to advair diskus [fluticasone-salmeterol]; augmentin [amoxicillin-pot clavulanate]; cephalosporins; codeine; hydrochlorothiazide; sulfa antibiotics; ciprofloxacin; demerol [meperidine]; keflex [cephalexin]; and morphine and related.  MEDICATIONS:  Current Outpatient Medications  Medication Sig Dispense Refill  . ALPRAZolam (XANAX) 0.25 MG tablet Take 0.25 mg by mouth at bedtime as needed for anxiety.     . calcitRIOL (ROCALTROL) 0.5 MCG capsule Take 0.5 mcg by mouth 2 (two) times daily.    . Cyanocobalamin (VITAMIN B 12 PO) Take 1,000 mcg by mouth daily.    . dapsone 25 MG tablet     . folic acid (FOLVITE) 1 MG tablet Take 1 mg by mouth daily.    Marland Kitchen levalbuterol (XOPENEX HFA) 45 MCG/ACT inhaler Inhale 1-2 puffs into the lungs 2 (two) times daily as needed for wheezing.    Marland Kitchen levothyroxine (SYNTHROID, LEVOTHROID) 150 MCG tablet Take 150 mcg by mouth daily before breakfast.    . potassium citrate (UROCIT-K) 10 MEQ (1080 MG) SR tablet Take 10 mEq by mouth.     . pyridoxine (B-6) 100 MG tablet Take 100 mg by mouth daily.    . VENTOLIN HFA 108 (90 BASE) MCG/ACT inhaler     . budesonide-formoterol (SYMBICORT) 80-4.5 MCG/ACT inhaler Inhale 2 puffs into the  lungs 2 (two) times daily.    . Cholecalciferol (VITAMIN D) 1000 UNITS capsule Take 1,000 Units by mouth daily.    . naproxen (NAPROSYN) 500 MG tablet Take 500 mg by mouth as needed.     Marland Kitchen tiZANidine (ZANAFLEX) 4 MG tablet Take 4 mg by mouth once. Reported on 09/30/2015    . valACYclovir (VALTREX) 1000 MG tablet Take 1,000 mg by mouth 2 (two) times daily as needed. Reported on 09/30/2015     No current facility-administered medications for this visit.      PHYSICAL EXAMINATION: ECOG PERFORMANCE STATUS: 0 - Asymptomatic Vitals:   08/04/17 0951  BP: (!) 156/83  Pulse: 70  Resp: 18  Temp: 97.6 F (36.4 C)   Filed Weights   08/04/17 0951  Weight: 250 lb 1 oz (113.4 kg)    Physical Exam  Constitutional: She is oriented to person, place, and time. She appears well-developed and well-nourished. No distress.  HENT:  Head: Normocephalic and atraumatic.  Right Ear: External ear normal.  Left Ear: External ear normal.  Mouth/Throat: Oropharynx is clear and moist.  Eyes: Pupils are equal, round, and reactive to light. Conjunctivae and EOM are normal. No scleral icterus.  Neck: Normal range of motion. Neck supple.  Cardiovascular: Normal rate,  regular rhythm and normal heart sounds.  Pulmonary/Chest: Effort normal and breath sounds normal. No respiratory distress. She has no wheezes. She has no rales. She exhibits no tenderness.  Abdominal: Soft. Bowel sounds are normal. She exhibits no distension and no mass. There is no tenderness.  Musculoskeletal: Normal range of motion. She exhibits no edema or deformity.  Lymphadenopathy:    She has no cervical adenopathy.  Neurological: She is alert and oriented to person, place, and time. No cranial nerve deficit. Coordination normal.  Skin: Skin is warm and dry. No rash noted.  Psychiatric: She has a normal mood and affect. Her behavior is normal. Thought content normal.     LABORATORY DATA:  I have reviewed the data as listed Lab Results  Component Value Date   WBC 4.0 08/04/2017   HGB 12.9 08/04/2017   HCT 37.3 08/04/2017   MCV 104.3 (H) 08/04/2017   PLT 149 (L) 08/04/2017   Recent Labs    08/04/17 1024  NA 139  K 3.4*  CL 104  CO2 25  GLUCOSE 105*  BUN 24*  CREATININE 1.26*  CALCIUM 10.0  GFRNONAA 44*  GFRAA 51*  PROT 7.4  ALBUMIN 4.1  AST 54*  ALT 44  ALKPHOS 47  BILITOT 1.4*       ASSESSMENT & PLAN:  1. Anemia, unspecified type   2. Thrombocytopenia (HCC)   3. 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  Reviewed patient's previous labs it appears that her counts are gradually declining.   I will start with basic lab work-up including CBC, CMP, smear review, LDH, B12, folate, flow cytometry, HIV antibody, hepatitis panel, iron TIBC and ferritin.I discussed that if all these test are nonconclusive, a bone marrow biopsy evaluation will be recommended.  Patient currently does not want to proceed bone marrow biopsy and we will discuss about this if indicated in the future.  All questions were answered. The patient knows to call the clinic with  any problems questions or concerns.  Return of visit: 2 weeks to discuss labs. Thank you for this kind referral and the opportunity to participate in  the care of this patient. A copy of today's note is routed to referring provider  Total face to face encounter time for this patient visit was 45 min. >50% of the time was  spent in counseling and coordination of care.    Earlie Server, MD, PhD Hematology Oncology [facility] at [facility] Pager- [phone] 08/04/2017

## 2017-08-05 LAB — HEPATITIS PANEL, ACUTE
HCV Ab: <0.1 s/co ratio (ref 0.0–0.9)
HEP B C IGM: NEGATIVE
Hep A IgM: NEGATIVE
Hepatitis B Surface Ag: NEGATIVE

## 2017-08-05 LAB — HIV ANTIBODY (ROUTINE TESTING W REFLEX): HIV Screen 4th Generation wRfx: NONREACTIVE

## 2017-08-06 LAB — COMP PANEL: LEUKEMIA/LYMPHOMA: CLINICAL INFO: 22

## 2017-08-18 ENCOUNTER — Encounter: Payer: Self-pay | Admitting: Oncology

## 2017-08-18 ENCOUNTER — Inpatient Hospital Stay: Payer: BLUE CROSS/BLUE SHIELD | Attending: Oncology | Admitting: Oncology

## 2017-08-18 ENCOUNTER — Inpatient Hospital Stay: Payer: BLUE CROSS/BLUE SHIELD

## 2017-08-18 VITALS — BP 141/77 | HR 89 | Temp 98.6°F | Resp 18 | Wt 243.4 lb

## 2017-08-18 DIAGNOSIS — Z85850 Personal history of malignant neoplasm of thyroid: Secondary | ICD-10-CM | POA: Insufficient documentation

## 2017-08-18 DIAGNOSIS — Z79899 Other long term (current) drug therapy: Secondary | ICD-10-CM | POA: Diagnosis not present

## 2017-08-18 DIAGNOSIS — D72819 Decreased white blood cell count, unspecified: Secondary | ICD-10-CM

## 2017-08-18 DIAGNOSIS — R5381 Other malaise: Secondary | ICD-10-CM | POA: Insufficient documentation

## 2017-08-18 DIAGNOSIS — R5382 Chronic fatigue, unspecified: Secondary | ICD-10-CM | POA: Diagnosis not present

## 2017-08-18 DIAGNOSIS — R17 Unspecified jaundice: Secondary | ICD-10-CM | POA: Insufficient documentation

## 2017-08-18 DIAGNOSIS — D696 Thrombocytopenia, unspecified: Secondary | ICD-10-CM

## 2017-08-18 DIAGNOSIS — F419 Anxiety disorder, unspecified: Secondary | ICD-10-CM | POA: Insufficient documentation

## 2017-08-18 DIAGNOSIS — D61818 Other pancytopenia: Secondary | ICD-10-CM | POA: Insufficient documentation

## 2017-08-18 DIAGNOSIS — D649 Anemia, unspecified: Secondary | ICD-10-CM

## 2017-08-18 DIAGNOSIS — K219 Gastro-esophageal reflux disease without esophagitis: Secondary | ICD-10-CM | POA: Insufficient documentation

## 2017-08-18 DIAGNOSIS — E892 Postprocedural hypoparathyroidism: Secondary | ICD-10-CM | POA: Diagnosis not present

## 2017-08-18 LAB — CBC WITH DIFFERENTIAL/PLATELET
BASOS PCT: 0 %
Basophils Absolute: 0 10*3/uL (ref 0–0.1)
EOS ABS: 0 10*3/uL (ref 0–0.7)
EOS PCT: 0 %
HCT: 36 % (ref 35.0–47.0)
Hemoglobin: 12.3 g/dL (ref 12.0–16.0)
LYMPHS ABS: 0.8 10*3/uL — AB (ref 1.0–3.6)
Lymphocytes Relative: 22 %
MCH: 35.8 pg — AB (ref 26.0–34.0)
MCHC: 34.1 g/dL (ref 32.0–36.0)
MCV: 104.7 fL — ABNORMAL HIGH (ref 80.0–100.0)
Monocytes Absolute: 0.3 10*3/uL (ref 0.2–0.9)
Monocytes Relative: 9 %
Neutro Abs: 2.7 10*3/uL (ref 1.4–6.5)
Neutrophils Relative %: 69 %
PLATELETS: 156 10*3/uL (ref 150–440)
RBC: 3.44 MIL/uL — AB (ref 3.80–5.20)
RDW: 13.1 % (ref 11.5–14.5)
WBC: 3.9 10*3/uL (ref 3.6–11.0)

## 2017-08-18 LAB — BILIRUBIN, TOTAL: BILIRUBIN TOTAL: 1.6 mg/dL — AB (ref 0.3–1.2)

## 2017-08-18 LAB — BILIRUBIN, DIRECT: BILIRUBIN DIRECT: 0.3 mg/dL (ref 0.1–0.5)

## 2017-08-18 NOTE — Progress Notes (Signed)
Pt and daughter in for follow up.  Pt nervous about visit today.  Otherwise, denies any difficulties.
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
## 2017-08-19 LAB — HAPTOGLOBIN: Haptoglobin: 29 mg/dL — ABNORMAL LOW (ref 34–200)

## 2017-08-23 ENCOUNTER — Other Ambulatory Visit: Payer: Self-pay | Admitting: General Surgery

## 2017-08-23 DIAGNOSIS — N6320 Unspecified lump in the left breast, unspecified quadrant: Secondary | ICD-10-CM

## 2017-08-23 DIAGNOSIS — R928 Other abnormal and inconclusive findings on diagnostic imaging of breast: Secondary | ICD-10-CM

## 2017-08-23 DIAGNOSIS — R921 Mammographic calcification found on diagnostic imaging of breast: Secondary | ICD-10-CM

## 2017-08-26 ENCOUNTER — Ambulatory Visit
Admission: RE | Admit: 2017-08-26 | Discharge: 2017-08-26 | Disposition: A | Discharge status: Home / Self Care | Payer: BLUE CROSS/BLUE SHIELD | Source: Ambulatory Visit | Attending: General Surgery | Admitting: General Surgery

## 2017-08-26 DIAGNOSIS — N6320 Unspecified lump in the left breast, unspecified quadrant: Secondary | ICD-10-CM | POA: Insufficient documentation

## 2017-08-26 DIAGNOSIS — R921 Mammographic calcification found on diagnostic imaging of breast: Secondary | ICD-10-CM | POA: Diagnosis present

## 2017-08-26 DIAGNOSIS — R928 Other abnormal and inconclusive findings on diagnostic imaging of breast: Secondary | ICD-10-CM | POA: Insufficient documentation

## 2017-08-26 HISTORY — PX: BREAST BIOPSY: SHX20

## 2017-08-27 LAB — SURGICAL PATHOLOGY

## 2017-09-06 ENCOUNTER — Other Ambulatory Visit: Payer: Self-pay | Admitting: General Surgery

## 2017-09-06 ENCOUNTER — Ambulatory Visit: Payer: Self-pay | Admitting: General Surgery

## 2017-09-06 DIAGNOSIS — D242 Benign neoplasm of left breast: Secondary | ICD-10-CM

## 2017-09-06 NOTE — H&P (Signed)
HISTORY OF PRESENT ILLNESS:    Ms. Hightower is a 67 y.o.female patient who comes for discussion of her left breast core needle biopsy.   Had stereotactic biopsy on 08/26/17 and was found with intraductal papilloma with focal sclerosis and coarse calcifications and is negative for atypia or malignancy. Patient was oriented about pathology results and recommendations. Patient refers tolerated biopsy well. No complain since last visit.      Patient had screening mammogram on 07/16/17 and was found with calcifications there were more pronounced from previous mammogram and diagnostic mammogram was done. A diagnostic mammogram was done on 07/30/17 which showed an increase in calcifications. Due to the findings I ordered a the core needle biopsy.   PAST MEDICAL HISTORY:      Past Medical History:  Diagnosis Date  . Anxiety   . Asthma   . B12 deficiency 01/16/2015  . Chest pain, unspecified 02/2002   stress-induced, with negative Myoview-ETT  . Grief reaction 2008  . Herpes labialis    recurrent  . History of anemia    previously on iron supplements  . Hypothyroidism, postsurgical   . Insomnia   . Kidney stone 04/2012   calcium cxylate  Dr Jacqlyn Larsen  . Leg edema    recurrent, most likely secondary to venous insufficiency  . Leukopenia    intermittent  . Malignant neoplasm of thyroid gland (CMS-HCC)    stage 1 T1N0Mx, 1.1 cm right thyroid nodule   . Osteoarthritis ([place]) 12/20/2013    a.  Right hand.   b.  Trigger nodules.   c.  Knees.   . Pleural effusion    Hospitalized 2/11 with pleuritic chest pain.  Found to have left pleural effusion; exudative on tap, 1700 ml removed;cytology/cultures negative. P-ANCA positive, MPO ANCA positive, anti SCL70 Ab positive. Evaluated by Dr. Jamal Collin, Dr. Raul Del, and Dr. Precious Reel.  Bronchoscopy performed March 2011. Follow up CTs scans March 2011 showing improvement.   Marland Kitchen Postsurgical hypoparathyroidism (CMS-HCC)   . Stress incontinence     evaluation by GYN 03/2004, Detrol ineffective        PAST SURGICAL HISTORY:        Past Surgical History:  Procedure Laterality Date  . ABDOMINAL HYSTERECTOMY     Bilateral salpingo-oophorectomy  . Breast cyst removal  1973  . CHOLECYSTECTOMY    . COLONOSCOPY  09/01/2005   Hyperplastic Polyp: CBF 08/2015; Recall Ltr mailed 06/25/2015 (dw)  . COLONOSCOPY  09/30/2015   Adenomatous Polyps: CBF 09/2018  . EGD  04/06/2011   06/03/1990, 02/04/1990; No repeat per RTE  . hemithyroidectomy Right 1989  . kidney stone removed     4 stones removed  . right knee ACL Right 1996  . THYROIDECTOMY TOTAL  06/2009   Dr Carlis Abbott         MEDICATIONS:  EncounterMedications  Outpatient Encounter Medications as of 09/06/2017  Medication Sig Dispense Refill  . albuterol 90 mcg/actuation inhaler Inhale 2 inhalations into the lungs every 6 (six) hours as needed for Wheezing 1 Inhaler 11  . ALPRAZolam (XANAX) 0.25 MG tablet Take 1 tablet (0.25 mg total) by mouth nightly as needed for Sleep. 30 tablet 5  . calcitRIOL (ROCALTROL) 0.5 MCG capsule Take a total of 4 tabs daily 120 capsule 11  . cyanocobalamin (VITAMIN B12) 1000 MCG tablet Take 1,000 mcg by mouth once daily.    . dapsone 100 MG tablet Take 0.5 tablets (50 mg total) by mouth once daily. 30 tablet 11  . folic acid (FOLVITE)  1 MG tablet Take 1 mg by mouth once daily.    . FUROsemide (LASIX) 20 MG tablet Take 1 tablet (20 mg total) by mouth once daily. 30 tablet 11  . indapamide (LOZOL) 1.25 MG tablet Take 1/2 tablet daily     . levalbuterol (XOPENEX HFA) inhaler Inhale 2 inhalations into the lungs every 6 (six) hours as needed for Wheezing. As needed 1 Inhaler 10  . levothyroxine (SYNTHROID) 150 MCG tablet Take 1 tablet (150 mcg total) by mouth once daily Take on an empty stomach with a glass of water at least 30-60 minutes before breakfast. 30 tablet 11  . potassium citrate (UROCIT-K) 10 mEq ER tablet Take 2 tablets (20 mEq  total) by mouth once daily 180 tablet 3  . pyridoxine, vitamin B6, (B-6) 100 MG tablet Take 100 mg by mouth once daily.    Marland Kitchen triamcinolone 0.5 % cream Apply topically 2 (two) times daily as needed (ant bites/rash) 15 g 1  . valACYclovir (VALTREX) 1000 MG tablet As needed  4   No facility-administered encounter medications on file as of 09/06/2017.        ALLERGIES:   Advair diskus  [fluticasone propion-salmeterol]; Cephalosporins; Codeine; Demerol  [meperidine]; Hydrochlorothiazide; Morphine; Sulfa (sulfonamide antibiotics); and Ciprofloxacin   SOCIAL HISTORY:  Social History          Socioeconomic History  . Marital status: Married    Spouse name: Not on file  . Number of children: Not on file  . Years of education: Not on file  . Highest education level: Not on file  Occupational History  . Not on file  Social Needs  . Financial resource strain: Not on file  . Food insecurity:    Worry: Not on file    Inability: Not on file  . Transportation needs:    Medical: Not on file    Non-medical: Not on file  Tobacco Use  . Smoking status: Never Smoker  . Smokeless tobacco: Never Used  Substance and Sexual Activity  . Alcohol use: Yes    Alcohol/week: 0.0 oz    Comment: seldom  . Drug use: No  . Sexual activity: Defer  Other Topics Concern  . Not on file  Social History Narrative  . Not on file      FAMILY HISTORY:       Family History  Problem Relation Age of Onset  . Heart failure Mother   . Coronary Artery Disease (Blocked arteries around heart) Mother   . COPD Father   . Heart disease Father   . Heart disease Maternal Grandmother   . Breast cancer Maternal Grandmother   . Heart disease Maternal Grandfather   . Heart disease Paternal Grandmother   . Heart disease Paternal Grandfather      GENERAL REVIEW OF SYSTEMS:   General ROS: negative for - chills, fatigue, fever, weight gain or weight loss Allergy and Immunology ROS:  negative for - hives  Hematological and Lymphatic ROS: negative for - bleeding problems or bruising, negative for palpable nodes Endocrine ROS: negative for - heat or cold intolerance, hair changes Respiratory ROS: negative for - cough, shortness of breath or wheezing Cardiovascular ROS: no chest pain or palpitations GI ROS: negative for nausea, vomiting, abdominal pain, diarrhea, constipation Musculoskeletal ROS: negative for - joint swelling or muscle pain Neurological ROS: negative for - confusion, syncope Dermatological ROS: negative for pruritus and rash  PHYSICAL EXAM:     Vitals:   09/06/17 0911  BP: 140/83  Pulse: 73  Temp: 36.4 C (97.5 F)  .  Ht:175.3 cm (5\' 9" ) Wt:(!) 112.5 kg (248 lb) BTY:OMAY surface area is 2.34 meters squared. Body mass index is 36.62 kg/m.Marland Kitchen   GENERAL: Alert, active, oriented x3  NECK: Supple with no palpable mass and no adenopathy.  LUNGS: Sound clear with no rales rhonchi or wheezes.  HEART: Regular rhythm S1 and S2 without murmur.  BREAST:breasts appear normal, no suspicious masses, no skin or nipple changes or axillary nodes. Left breast with resolving ecchymosis.   ABDOMEN: Soft and depressible, nontender with no palpable mass, no hepatomegaly.   EXTREMITIES: Well-developed well-nourished symmetrical with no dependent edema.  NEUROLOGICAL: Awake alert oriented, facial expression symmetrical, moving all extremities.   Pathology result discussed with patient: SPECIMEN SUBMITTED: A. Breast, left  CLINICAL HISTORY: Tubular structure with coarse dystrophic calcifications  PRE-OPERATIVE DIAGNOSIS: Favor benign  POST-OPERATIVE DIAGNOSIS: None provided.  DIAGNOSIS: A. BREAST, LEFT; STEREOTACTIC BIOPSY: - INTRADUCTAL PAPILLOMA WITH FOCAL SCLEROSIS AND COARSE CALCIFICATION. - NEGATIVE FOR ATYPIA AND MALIGNANCY.  GROSS DESCRIPTION: A. Labeled: Left breast medial breast above nipple Received: in a formalin-filled Brevera  collection device Accompanying specimen radiograph: Yes Time/Date in fixative: 9:02 AM on 08/26/2017 Cold ischemic time: 2 minutes Total fixation time: 8 hours Core pieces: Multiple Measurement: Aggregate, 4.7 x 0.6 x 0.2 cm Description / comments: Yellow lobulated fibrofatty Inked: Black Entirely submitted in cassette(s):  Final Diagnosis performed by Quay Burow, MD. 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 I showed the data of the recommendations which basically there is no strong recommendations for observation and/or surgical excision. Almost all patients with atypia with concordant images does not have any upgrade to atypia or malignancy. Due to the lack of strong evidence against excision and due to the low risk of upgrade to atypia or malignancy, patient prefers excision of the mass.   Patient was oriented about the surgical procedure which is needle guided excisional biopsy. Patient was oriented that the need of the wire placement before the surgery and the goal of surgery to take out the whole lesion with negative margins. Patient oriented about risks of complications being: infection, bleeding, seroma, hematoma, pain, scarring. Patient oriented that if lesion is upgraded to malignancy and margins are positive there will be a need for re excision.    PLAN:  1. Left breast needle guided excisional biopsy (19125) 2. CBC, CMP 3. Internal Medicine clearance (Dr. Caryl Comes) 4. Avoid taking any aspirin 5 days before surgery 5. Contact us if has any question or concern  Patient verbalized understanding, all questions were answered, and were agreeable with the plan outlined above.   Herbert Pun, MD  Electronically signed by Herbert Pun,  MD

## 2017-09-06 NOTE — H&P (View-Only) (Signed)
HISTORY OF PRESENT ILLNESS:    Maureen Brown is a 67 y.o.female patient who comes for discussion of her left breast core needle biopsy.   Had stereotactic biopsy on 08/26/17 and was found with intraductal papilloma with focal sclerosis and coarse calcifications and is negative for atypia or malignancy. Patient was oriented about pathology results and recommendations. Patient refers tolerated biopsy well. No complain since last visit.      Patient had screening mammogram on 07/16/17 and was found with calcifications there were more pronounced from previous mammogram and diagnostic mammogram was done. A diagnostic mammogram was done on 07/30/17 which showed an increase in calcifications. Due to the findings I ordered a the core needle biopsy.   PAST MEDICAL HISTORY:      Past Medical History:  Diagnosis Date  . Anxiety   . Asthma   . B12 deficiency 01/16/2015  . Chest pain, unspecified 02/2002   stress-induced, with negative Myoview-ETT  . Grief reaction 2008  . Herpes labialis    recurrent  . History of anemia    previously on iron supplements  . Hypothyroidism, postsurgical   . Insomnia   . Kidney stone 04/2012   calcium cxylate  Dr Jacqlyn Larsen  . Leg edema    recurrent, most likely secondary to venous insufficiency  . Leukopenia    intermittent  . Malignant neoplasm of thyroid gland (CMS-HCC)    stage 1 T1N0Mx, 1.1 cm right thyroid nodule   . Osteoarthritis ([place]) 12/20/2013    a.  Right hand.   b.  Trigger nodules.   c.  Knees.   . Pleural effusion    Hospitalized 2/11 with pleuritic chest pain.  Found to have left pleural effusion; exudative on tap, 1700 ml removed;cytology/cultures negative. P-ANCA positive, MPO ANCA positive, anti SCL70 Ab positive. Evaluated by Dr. Jamal Collin, Dr. Raul Del, and Dr. Precious Reel.  Bronchoscopy performed March 2011. Follow up CTs scans March 2011 showing improvement.   Marland Kitchen Postsurgical hypoparathyroidism (CMS-HCC)   . Stress incontinence     evaluation by GYN 03/2004, Detrol ineffective        PAST SURGICAL HISTORY:        Past Surgical History:  Procedure Laterality Date  . ABDOMINAL HYSTERECTOMY     Bilateral salpingo-oophorectomy  . Breast cyst removal  1973  . CHOLECYSTECTOMY    . COLONOSCOPY  09/01/2005   Hyperplastic Polyp: CBF 08/2015; Recall Ltr mailed 06/25/2015 (dw)  . COLONOSCOPY  09/30/2015   Adenomatous Polyps: CBF 09/2018  . EGD  04/06/2011   06/03/1990, 02/04/1990; No repeat per RTE  . hemithyroidectomy Right 1989  . kidney stone removed     4 stones removed  . right knee ACL Right 1996  . THYROIDECTOMY TOTAL  06/2009   Dr Carlis Abbott         MEDICATIONS:  EncounterMedications  Outpatient Encounter Medications as of 09/06/2017  Medication Sig Dispense Refill  . albuterol 90 mcg/actuation inhaler Inhale 2 inhalations into the lungs every 6 (six) hours as needed for Wheezing 1 Inhaler 11  . ALPRAZolam (XANAX) 0.25 MG tablet Take 1 tablet (0.25 mg total) by mouth nightly as needed for Sleep. 30 tablet 5  . calcitRIOL (ROCALTROL) 0.5 MCG capsule Take a total of 4 tabs daily 120 capsule 11  . cyanocobalamin (VITAMIN B12) 1000 MCG tablet Take 1,000 mcg by mouth once daily.    . dapsone 100 MG tablet Take 0.5 tablets (50 mg total) by mouth once daily. 30 tablet 11  . folic acid (FOLVITE)  1 MG tablet Take 1 mg by mouth once daily.    . FUROsemide (LASIX) 20 MG tablet Take 1 tablet (20 mg total) by mouth once daily. 30 tablet 11  . indapamide (LOZOL) 1.25 MG tablet Take 1/2 tablet daily     . levalbuterol (XOPENEX HFA) inhaler Inhale 2 inhalations into the lungs every 6 (six) hours as needed for Wheezing. As needed 1 Inhaler 10  . levothyroxine (SYNTHROID) 150 MCG tablet Take 1 tablet (150 mcg total) by mouth once daily Take on an empty stomach with a glass of water at least 30-60 minutes before breakfast. 30 tablet 11  . potassium citrate (UROCIT-K) 10 mEq ER tablet Take 2 tablets (20 mEq  total) by mouth once daily 180 tablet 3  . pyridoxine, vitamin B6, (B-6) 100 MG tablet Take 100 mg by mouth once daily.    Marland Kitchen triamcinolone 0.5 % cream Apply topically 2 (two) times daily as needed (ant bites/rash) 15 g 1  . valACYclovir (VALTREX) 1000 MG tablet As needed  4   No facility-administered encounter medications on file as of 09/06/2017.        ALLERGIES:   Advair diskus  [fluticasone propion-salmeterol]; Cephalosporins; Codeine; Demerol  [meperidine]; Hydrochlorothiazide; Morphine; Sulfa (sulfonamide antibiotics); and Ciprofloxacin   SOCIAL HISTORY:  Social History          Socioeconomic History  . Marital status: Married    Spouse name: Not on file  . Number of children: Not on file  . Years of education: Not on file  . Highest education level: Not on file  Occupational History  . Not on file  Social Needs  . Financial resource strain: Not on file  . Food insecurity:    Worry: Not on file    Inability: Not on file  . Transportation needs:    Medical: Not on file    Non-medical: Not on file  Tobacco Use  . Smoking status: Never Smoker  . Smokeless tobacco: Never Used  Substance and Sexual Activity  . Alcohol use: Yes    Alcohol/week: 0.0 oz    Comment: seldom  . Drug use: No  . Sexual activity: Defer  Other Topics Concern  . Not on file  Social History Narrative  . Not on file      FAMILY HISTORY:       Family History  Problem Relation Age of Onset  . Heart failure Mother   . Coronary Artery Disease (Blocked arteries around heart) Mother   . COPD Father   . Heart disease Father   . Heart disease Maternal Grandmother   . Breast cancer Maternal Grandmother   . Heart disease Maternal Grandfather   . Heart disease Paternal Grandmother   . Heart disease Paternal Grandfather      GENERAL REVIEW OF SYSTEMS:   General ROS: negative for - chills, fatigue, fever, weight gain or weight loss Allergy and Immunology ROS:  negative for - hives  Hematological and Lymphatic ROS: negative for - bleeding problems or bruising, negative for palpable nodes Endocrine ROS: negative for - heat or cold intolerance, hair changes Respiratory ROS: negative for - cough, shortness of breath or wheezing Cardiovascular ROS: no chest pain or palpitations GI ROS: negative for nausea, vomiting, abdominal pain, diarrhea, constipation Musculoskeletal ROS: negative for - joint swelling or muscle pain Neurological ROS: negative for - confusion, syncope Dermatological ROS: negative for pruritus and rash  PHYSICAL EXAM:     Vitals:   09/06/17 0911  BP: 140/83  Pulse: 73  Temp: 36.4 C (97.5 F)  .  Ht:175.3 cm (5\' 9" ) Wt:(!) 112.5 kg (248 lb) OTL:XBWI surface area is 2.34 meters squared. Body mass index is 36.62 kg/m.Marland Kitchen   GENERAL: Alert, active, oriented x3  NECK: Supple with no palpable mass and no adenopathy.  LUNGS: Sound clear with no rales rhonchi or wheezes.  HEART: Regular rhythm S1 and S2 without murmur.  BREAST:breasts appear normal, no suspicious masses, no skin or nipple changes or axillary nodes. Left breast with resolving ecchymosis.   ABDOMEN: Soft and depressible, nontender with no palpable mass, no hepatomegaly.   EXTREMITIES: Well-developed well-nourished symmetrical with no dependent edema.  NEUROLOGICAL: Awake alert oriented, facial expression symmetrical, moving all extremities.   Pathology result discussed with patient: SPECIMEN SUBMITTED: A. Breast, left  CLINICAL HISTORY: Tubular structure with coarse dystrophic calcifications  PRE-OPERATIVE DIAGNOSIS: Favor benign  POST-OPERATIVE DIAGNOSIS: None provided.  DIAGNOSIS: A. BREAST, LEFT; STEREOTACTIC BIOPSY: - INTRADUCTAL PAPILLOMA WITH FOCAL SCLEROSIS AND COARSE CALCIFICATION. - NEGATIVE FOR ATYPIA AND MALIGNANCY.  GROSS DESCRIPTION: A. Labeled: Left breast medial breast above nipple Received: in a formalin-filled Brevera  collection device Accompanying specimen radiograph: Yes Time/Date in fixative: 9:02 AM on 08/26/2017 Cold ischemic time: 2 minutes Total fixation time: 8 hours Core pieces: Multiple Measurement: Aggregate, 4.7 x 0.6 x 0.2 cm Description / comments: Yellow lobulated fibrofatty Inked: Black Entirely submitted in cassette(s):  Final Diagnosis performed by Quay Burow, MD. 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 I showed the data of the recommendations which basically there is no strong recommendations for observation and/or surgical excision. Almost all patients with atypia with concordant images does not have any upgrade to atypia or malignancy. Due to the lack of strong evidence against excision and due to the low risk of upgrade to atypia or malignancy, patient prefers excision of the mass.   Patient was oriented about the surgical procedure which is needle guided excisional biopsy. Patient was oriented that the need of the wire placement before the surgery and the goal of surgery to take out the whole lesion with negative margins. Patient oriented about risks of complications being: infection, bleeding, seroma, hematoma, pain, scarring. Patient oriented that if lesion is upgraded to malignancy and margins are positive there will be a need for re excision.    PLAN:  1. Left breast needle guided excisional biopsy (19125) 2. CBC, CMP 3. Internal Medicine clearance (Dr. Caryl Comes) 4. Avoid taking any aspirin 5 days before surgery 5. Contact us if has any question or concern  Patient verbalized understanding, all questions were answered, and were agreeable with the plan outlined above.   Herbert Pun, MD  Electronically signed by Herbert Pun,  MD

## 2017-09-09 ENCOUNTER — Other Ambulatory Visit: Payer: Self-pay

## 2017-09-09 ENCOUNTER — Encounter
Admission: RE | Admit: 2017-09-09 | Discharge: 2017-09-09 | Disposition: A | Discharge status: Home / Self Care | Payer: BLUE CROSS/BLUE SHIELD | Source: Ambulatory Visit | Attending: General Surgery | Admitting: General Surgery

## 2017-09-09 DIAGNOSIS — Z01818 Encounter for other preprocedural examination: Secondary | ICD-10-CM | POA: Diagnosis present

## 2017-09-09 DIAGNOSIS — J45909 Unspecified asthma, uncomplicated: Secondary | ICD-10-CM | POA: Insufficient documentation

## 2017-09-09 HISTORY — DX: Pneumonia, unspecified organism: J18.9

## 2017-09-09 HISTORY — DX: Personal history of urinary calculi: Z87.442

## 2017-09-09 HISTORY — DX: Major depressive disorder, single episode, unspecified: F32.9

## 2017-09-09 HISTORY — DX: Depression, unspecified: F32.A

## 2017-09-09 NOTE — Patient Instructions (Addendum)
Your procedure is scheduled on: Monday September 20, 2017 Report to the [facility]. Follow instructions from the [facility] about your arrival time.  REMEMBER: Instructions that are not followed completely may result in serious medical risk, up to and including death; or upon the discretion of your surgeon and anesthesiologist your surgery may need to be rescheduled.  Do not eat food after midnight the night before your procedure.  No gum chewing, lozengers or hard candies.  You may however, drink CLEAR liquids up to 2 hours before you are scheduled to arrive for your surgery. Do not drink anything within 2 hours of the start of your surgery.  Clear liquids include: - water  - apple juice without pulp - clear gatorade - black coffee or tea (Do NOT add anything to the coffee or tea) Do NOT drink anything that is not on this list.  No Alcohol for 24 hours before or after surgery.  No Smoking including e-cigarettes for 24 hours prior to surgery.  No chewable tobacco products for at least 6 hours prior to surgery.  No nicotine patches on the day of surgery.  On the morning of surgery brush your teeth with toothpaste and water, you may rinse your mouth with mouthwash if you wish. Do not swallow any toothpaste or mouthwash.  Notify your doctor if there is any change in your medical condition (cold, fever, infection).  Do not wear jewelry, make-up, hairpins, clips or nail polish.  Do not wear lotions, powders, or perfumes.  Do not shave 48 hours prior to surgery.   Contacts and dentures may not be worn into surgery.  Do not bring valuables to the hospital, including drivers license, insurance or credit cards.  [facility] is not responsible for any belongings or valuables.   TAKE THESE MEDICATIONS THE MORNING OF SURGERY:  1.  Alprazolam (if needed for anxiety) 2.  Dapsone 3.  xopenex inhaler 4.  Levothyroxine 5.  tamsulosin 6.  Ventolin inhaler  Use CHG  Soap  as directed on instruction sheet.  Use inhalers on the day of surgery and bring to the hospital.  On July 1 - Stop Anti-inflammatories (NSAIDS) such as Advil, Aleve, Ibuprofen, Motrin, Naproxen, Naprosyn and Aspirin based products such as Excedrin, Goodys Powder, BC Powder. (May take Tylenol or Acetaminophen if needed.)  On July 1 - Stop ANY OVER THE COUNTER supplements until after surgery. (iron, folic acid,  (May continue Vitamin D, Vitamin B, and multivitamin.)  Wear comfortable clothing (specific to your surgery type) to the hospital.  Plan for stool softeners for home use.  If you are being discharged the day of surgery, you will not be allowed to drive home. You will need a responsible adult to drive you home and stay with you that night.   If you are taking public transportation, you will need to have a responsible adult with you. Please confirm with your physician that it is acceptable to use public transportation.   Please call [phone] if you have any questions about these instructions.

## 2017-09-19 MED ORDER — VANCOMYCIN HCL IN DEXTROSE 1-5 GM/200ML-% IV SOLN
1000.0000 mg | INTRAVENOUS | Status: AC
  Administered 2017-09-20: 1000 mg via INTRAVENOUS

## 2017-09-20 ENCOUNTER — Ambulatory Visit
Admission: RE | Admit: 2017-09-20 | Discharge: 2017-09-20 | Disposition: A | Discharge status: Home / Self Care | Payer: BLUE CROSS/BLUE SHIELD | Source: Ambulatory Visit | Attending: General Surgery | Admitting: General Surgery

## 2017-09-20 ENCOUNTER — Ambulatory Visit: Payer: BLUE CROSS/BLUE SHIELD | Admitting: Certified Registered"

## 2017-09-20 ENCOUNTER — Encounter: Payer: Self-pay | Admitting: Anesthesiology

## 2017-09-20 ENCOUNTER — Encounter
Admission: RE | Disposition: A | Discharge status: Home / Self Care | Payer: Self-pay | Source: Ambulatory Visit | Attending: General Surgery

## 2017-09-20 DIAGNOSIS — Z85850 Personal history of malignant neoplasm of thyroid: Secondary | ICD-10-CM | POA: Diagnosis not present

## 2017-09-20 DIAGNOSIS — E892 Postprocedural hypoparathyroidism: Secondary | ICD-10-CM | POA: Insufficient documentation

## 2017-09-20 DIAGNOSIS — D242 Benign neoplasm of left breast: Secondary | ICD-10-CM

## 2017-09-20 DIAGNOSIS — Z79899 Other long term (current) drug therapy: Secondary | ICD-10-CM | POA: Diagnosis not present

## 2017-09-20 DIAGNOSIS — I739 Peripheral vascular disease, unspecified: Secondary | ICD-10-CM | POA: Insufficient documentation

## 2017-09-20 DIAGNOSIS — E538 Deficiency of other specified B group vitamins: Secondary | ICD-10-CM | POA: Diagnosis not present

## 2017-09-20 DIAGNOSIS — Z79890 Hormone replacement therapy (postmenopausal): Secondary | ICD-10-CM | POA: Diagnosis not present

## 2017-09-20 DIAGNOSIS — F419 Anxiety disorder, unspecified: Secondary | ICD-10-CM | POA: Diagnosis not present

## 2017-09-20 DIAGNOSIS — Y848 Other medical procedures as the cause of abnormal reaction of the patient, or of later complication, without mention of misadventure at the time of the procedure: Secondary | ICD-10-CM | POA: Insufficient documentation

## 2017-09-20 DIAGNOSIS — D649 Anemia, unspecified: Secondary | ICD-10-CM | POA: Diagnosis not present

## 2017-09-20 DIAGNOSIS — E890 Postprocedural hypothyroidism: Secondary | ICD-10-CM | POA: Insufficient documentation

## 2017-09-20 DIAGNOSIS — F329 Major depressive disorder, single episode, unspecified: Secondary | ICD-10-CM | POA: Insufficient documentation

## 2017-09-20 DIAGNOSIS — J45909 Unspecified asthma, uncomplicated: Secondary | ICD-10-CM | POA: Diagnosis not present

## 2017-09-20 HISTORY — PX: BREAST LUMPECTOMY WITH NEEDLE LOCALIZATION: SHX5759

## 2017-09-20 HISTORY — PX: BREAST EXCISIONAL BIOPSY: SUR124

## 2017-09-20 SURGERY — BREAST LUMPECTOMY WITH NEEDLE LOCALIZATION
Anesthesia: General | Site: Breast | Laterality: Left | Wound class: "Clean "

## 2017-09-20 MED ORDER — LACTATED RINGERS IV SOLN
INTRAVENOUS | Status: DC
  Administered 2017-09-20: 09:00:00 via INTRAVENOUS

## 2017-09-20 MED ORDER — BUPIVACAINE-EPINEPHRINE (PF) 0.5% -1:200000 IJ SOLN
INTRAMUSCULAR | Status: DC | PRN
  Administered 2017-09-20: 30 mL via PERINEURAL

## 2017-09-20 MED ORDER — MIDAZOLAM HCL 2 MG/2ML IJ SOLN
INTRAMUSCULAR | Status: AC
  Filled 2017-09-20: qty 2

## 2017-09-20 MED ORDER — PROPOFOL 10 MG/ML IV BOLUS
INTRAVENOUS | Status: AC
  Filled 2017-09-20: qty 20

## 2017-09-20 MED ORDER — ONDANSETRON HCL 4 MG/2ML IJ SOLN
INTRAMUSCULAR | Status: DC | PRN
  Administered 2017-09-20: 4 mg via INTRAVENOUS

## 2017-09-20 MED ORDER — PROPOFOL 10 MG/ML IV BOLUS
INTRAVENOUS | Status: DC | PRN
  Administered 2017-09-20: 50 mg via INTRAVENOUS
  Administered 2017-09-20: 150 mg via INTRAVENOUS

## 2017-09-20 MED ORDER — GLYCOPYRROLATE 0.2 MG/ML IJ SOLN
INTRAMUSCULAR | Status: DC | PRN
  Administered 2017-09-20: 0.2 mg via INTRAVENOUS

## 2017-09-20 MED ORDER — FENTANYL CITRATE (PF) 100 MCG/2ML IJ SOLN
INTRAMUSCULAR | Status: AC
  Filled 2017-09-20: qty 2

## 2017-09-20 MED ORDER — FENTANYL CITRATE (PF) 100 MCG/2ML IJ SOLN
INTRAMUSCULAR | Status: DC | PRN
  Administered 2017-09-20 (×2): 50 ug via INTRAVENOUS

## 2017-09-20 MED ORDER — ONDANSETRON HCL 4 MG/2ML IJ SOLN: 4.0000 mg | Freq: Once | INTRAMUSCULAR | Status: DC | PRN

## 2017-09-20 MED ORDER — VANCOMYCIN HCL IN DEXTROSE 1-5 GM/200ML-% IV SOLN
INTRAVENOUS | Status: AC
  Administered 2017-09-20: 1000 mg via INTRAVENOUS
  Filled 2017-09-20: qty 200

## 2017-09-20 MED ORDER — PHENYLEPHRINE HCL 10 MG/ML IJ SOLN
INTRAMUSCULAR | Status: DC | PRN
  Administered 2017-09-20: 200 ug via INTRAVENOUS

## 2017-09-20 MED ORDER — GABAPENTIN 300 MG PO CAPS: 300.0000 mg | ORAL_CAPSULE | Freq: Three times a day (TID) | ORAL | 0 refills | Status: DC

## 2017-09-20 MED ORDER — FAMOTIDINE 20 MG PO TABS
20.0000 mg | ORAL_TABLET | Freq: Once | ORAL | Status: AC
  Administered 2017-09-20: 20 mg via ORAL

## 2017-09-20 MED ORDER — LIDOCAINE HCL (CARDIAC) PF 100 MG/5ML IV SOSY
PREFILLED_SYRINGE | INTRAVENOUS | Status: DC | PRN
  Administered 2017-09-20: 80 mg via INTRAVENOUS

## 2017-09-20 MED ORDER — FENTANYL CITRATE (PF) 100 MCG/2ML IJ SOLN: 25.0000 ug | INTRAMUSCULAR | Status: DC | PRN

## 2017-09-20 MED ORDER — MIDAZOLAM HCL 2 MG/2ML IJ SOLN
INTRAMUSCULAR | Status: DC | PRN
  Administered 2017-09-20: 2 mg via INTRAVENOUS

## 2017-09-20 MED ORDER — DEXAMETHASONE SODIUM PHOSPHATE 10 MG/ML IJ SOLN
INTRAMUSCULAR | Status: DC | PRN
  Administered 2017-09-20: 10 mg via INTRAVENOUS

## 2017-09-20 MED ORDER — FAMOTIDINE 20 MG PO TABS
ORAL_TABLET | ORAL | Status: AC
  Administered 2017-09-20: 20 mg via ORAL
  Filled 2017-09-20: qty 1

## 2017-09-20 SURGICAL SUPPLY — 31 items
CANISTER SUCT 1200ML W/VALVE (MISCELLANEOUS) ×3 IMPLANT
CHLORAPREP W/TINT 26ML (MISCELLANEOUS) ×3 IMPLANT
CNTNR SPEC 2.5X3XGRAD LEK (MISCELLANEOUS) ×1
CONT SPEC 4OZ STER OR WHT (MISCELLANEOUS) ×2
CONTAINER SPEC 2.5X3XGRAD LEK (MISCELLANEOUS) ×1 IMPLANT
DERMABOND ADVANCED (GAUZE/BANDAGES/DRESSINGS) ×2
DERMABOND ADVANCED .7 DNX12 (GAUZE/BANDAGES/DRESSINGS) ×1 IMPLANT
DRAPE LAPAROTOMY 77X122 PED (DRAPES) ×3 IMPLANT
DRAPE SHEET LG 3/4 BI-LAMINATE (DRAPES) ×2 IMPLANT
ELECT REM PT RETURN 9FT ADLT (ELECTROSURGICAL) ×3
ELECTRODE REM PT RTRN 9FT ADLT (ELECTROSURGICAL) ×1 IMPLANT
GLOVE BIO SURGEON STRL SZ 6.5 (GLOVE) ×2 IMPLANT
GLOVE BIO SURGEONS STRL SZ 6.5 (GLOVE) ×1
GOWN STRL REUS W/ TWL LRG LVL3 (GOWN DISPOSABLE) ×3 IMPLANT
GOWN STRL REUS W/TWL LRG LVL3 (GOWN DISPOSABLE) ×6
KIT TURNOVER KIT A (KITS) ×3 IMPLANT
LABEL OR SOLS (LABEL) ×3 IMPLANT
MARGIN MAP 10MM (MISCELLANEOUS) ×3 IMPLANT
NDL HYPO 25X1 1.5 SAFETY (NEEDLE) ×2 IMPLANT
NEEDLE HYPO 25X1 1.5 SAFETY (NEEDLE) ×6 IMPLANT
PACK BASIN MINOR ARMC (MISCELLANEOUS) ×3 IMPLANT
SUT ETHILON 3-0 FS-10 30 BLK (SUTURE) ×3
SUT MNCRL 4-0 (SUTURE) ×2
SUT MNCRL 4-0 27XMFL (SUTURE) ×1
SUT SILK 2 0 SH (SUTURE) ×6 IMPLANT
SUT VIC AB 3-0 SH 27 (SUTURE) ×2
SUT VIC AB 3-0 SH 27X BRD (SUTURE) ×1 IMPLANT
SUTURE EHLN 3-0 FS-10 30 BLK (SUTURE) ×1 IMPLANT
SUTURE MNCRL 4-0 27XMF (SUTURE) ×1 IMPLANT
SYR 10ML LL (SYRINGE) ×3 IMPLANT
WATER STERILE IRR 1000ML POUR (IV SOLUTION) ×3 IMPLANT

## 2017-09-20 NOTE — Discharge Instructions (Addendum)
°  Diet: Resume home heart healthy regular diet.   Activity:No heavy lifting >20 pounds (children, pets, laundry, garbage) or strenuous activity until pain is resolved, but light activity and walking are encouraged. Do not drive or drink alcohol if taking narcotic pain medications.  Wound care: May shower with soapy water and pat dry (do not rub incisions), but no baths or submerging incision underwater until follow-up. (no swimming)   Medications: Resume all home medications. For mild to moderate pain: acetaminophen (Tylenol) or ibuprofen (if no kidney disease). Combining Tylenol with alcohol can substantially increase your risk of causing liver disease. Narcotic pain medications, if prescribed, can be used for severe pain, though may cause nausea, constipation, and drowsiness.  If you do not need the narcotic pain medication, you do not need to fill the prescription.  Call office [phone]) at any time if any questions, worsening pain, fevers/chills, bleeding, drainage from incision site, or other concerns.     AMBULATORY SURGERY  DISCHARGE INSTRUCTIONS   1) The drugs that you were given will stay in your system until tomorrow so for the next 24 hours you should not:  A) Drive an automobile B) Make any legal decisions C) Drink any alcoholic beverage   2) You may resume regular meals tomorrow.  Today it is better to start with liquids and gradually work up to solid foods.  You may eat anything you prefer, but it is better to start with liquids, then soup and crackers, and gradually work up to solid foods.   3) Please notify your doctor immediately if you have any unusual bleeding, trouble breathing, redness and pain at the surgery site, drainage, fever, or pain not relieved by medication.    4) Additional Instructions:        Please contact your physician with any problems or Same Day Surgery at [phone], Monday through Friday 6 am to 4 pm, or [facility] number at [phone].

## 2017-09-20 NOTE — Op Note (Signed)
Preoperative diagnosis: Left breast intraductal papilloma.  Postoperative diagnosis: Left breast intraductal papilloma.   Procedure: Left needle-localized breast lumpectomy.                       Anesthesia: GETA  Surgeon: Dr. Windell Moment  Wound Classification: Clean  Indications: Patient is a 67 y.o. female with a nonpalpable left breast mass noted on mammography with core biopsy demonstrating intraductal papilloma requires needle-localized lumpectomy for treatment.   Findings: 1. Specimen mammography shows marker and wire on specimen 2. Pathology call refers gross examination of margins was negative 3. No other palpable mass or lymph node identified.   Description of procedure: Preoperative needle localization was performed by radiology. Localization studies were reviewed. The patient was taken to the operating room and placed supine on the operating table, and after general anesthesia the left chest was prepped and draped in the usual sterile fashion. A time-out was completed verifying correct patient, procedure, site, positioning, and implant(s) and/or special equipment prior to beginning this procedure.  By comparing the localization studies with the direction and skin entry site of the needle, the probable trajectory and location of the mass was visualized. A circumareolar skin incision was planned in such a way as to minimize the amount of dissection to reach the mass.  The skin incision was made. Flaps were raised and the location of the wire confirmed. The wire was delivered into the wound. A 2-0 silk figure-of-eight stay suture was placed around the wire and used for retraction. Dissection was then taken down circumferentially, taking care to include the entire localizing needle and a wide margin of grossly normal tissue. The specimen and entire localizing wire were removed. The specimen was oriented and sent to radiology with the localization studies. Confirmation was received that  the entire target lesion had been resected. The wound was irrigated. Hemostasis was checked. The wound was closed with interrupted sutures of 3-0 Vicryl and a subcuticular suture of Monocryl 4-0. No attempt was made to close the dead space. A dressing was applied.  The patient tolerated the procedure well and was taken to the postanesthesia care unit in stable condition.   Specimen: Left Breast mass (Orientation markers used: Cranial, Caudal, Medial, Lateral,Skin, Deep)                 Complications: None  Estimated Blood Loss: 5 mL

## 2017-09-20 NOTE — Anesthesia Postprocedure Evaluation (Signed)
Anesthesia Post Note  Patient: Maureen Brown  Procedure(s) Performed: BREAST LUMPECTOMY WITH NEEDLE LOCALIZATION (Left Breast)  Patient location during evaluation: PACU Anesthesia Type: General Level of consciousness: awake and alert Pain management: pain level controlled Vital Signs Assessment: post-procedure vital signs reviewed and stable Respiratory status: spontaneous breathing, nonlabored ventilation, respiratory function stable and patient connected to nasal cannula oxygen Cardiovascular status: blood pressure returned to baseline and stable Postop Assessment: no apparent nausea or vomiting Anesthetic complications: no     Last Vitals:  Vitals:   09/20/17 1116 09/20/17 1140  BP: (!) 134/102 (!) 126/56  Pulse: 88 67  Resp: 16 18  Temp: 36.6 C 36.4 C  SpO2: 99%     Last Pain:  Vitals:   09/20/17 1140  TempSrc: Temporal  PainSc: 0-No pain                 Lusia Greis S

## 2017-09-20 NOTE — Transfer of Care (Signed)
Immediate Anesthesia Transfer of Care Note  Patient: Maureen Brown  Procedure(s) Performed: BREAST LUMPECTOMY WITH NEEDLE LOCALIZATION (Left Breast)  Patient Location: PACU  Anesthesia Type:General  Level of Consciousness: awake, alert  and oriented  Airway & Oxygen Therapy: Patient Spontanous Breathing and Patient connected to face mask oxygen  Post-op Assessment: Report given to RN and Post -op Vital signs reviewed and stable  Post vital signs: Reviewed and stable  Last Vitals:  Vitals Value Taken Time  BP    Temp    Pulse 90 09/20/2017 10:35 AM  Resp    SpO2 100 % 09/20/2017 10:35 AM  Vitals shown include unvalidated device data.  Last Pain:  Vitals:   09/20/17 0853  TempSrc: Tympanic  PainSc: 0-No pain         Complications: No apparent anesthesia complications

## 2017-09-20 NOTE — Anesthesia Preprocedure Evaluation (Addendum)
Anesthesia Evaluation  Patient identified by MRN, date of birth, ID band Patient awake    Reviewed: Allergy & Precautions, NPO status , Patient's Chart, lab work & pertinent test results, reviewed documented beta blocker date and time   Airway Mallampati: III  TM Distance: >3 FB     Dental  (+) Chipped   Pulmonary asthma , pneumonia, resolved,           Cardiovascular + Peripheral Vascular Disease       Neuro/Psych PSYCHIATRIC DISORDERS Anxiety Depression    GI/Hepatic GERD  Controlled,  Endo/Other  Hypothyroidism   Renal/GU      Musculoskeletal  (+) Arthritis ,   Abdominal   Peds  Hematology  (+) anemia ,   Anesthesia Other Findings EKG ok.  Reproductive/Obstetrics                             Anesthesia Physical Anesthesia Plan  ASA: III  Anesthesia Plan: General   Post-op Pain Management:    Induction: Intravenous  PONV Risk Score and Plan:   Airway Management Planned: LMA  Additional Equipment:   Intra-op Plan:   Post-operative Plan:   Informed Consent: I have reviewed the patients History and Physical, chart, labs and discussed the procedure including the risks, benefits and alternatives for the proposed anesthesia with the patient or authorized representative who has indicated his/her understanding and acceptance.     Plan Discussed with: CRNA  Anesthesia Plan Comments:         Anesthesia Quick Evaluation

## 2017-09-20 NOTE — Interval H&P Note (Signed)
History and Physical Interval Note:  09/20/2017 9:31 AM  Maureen Brown  has presented today for surgery, with the diagnosis of INTRADUCTAL PAPILLOMA LEFT BREAST  The various methods of treatment have been discussed with the patient and family. After consideration of risks, benefits and other options for treatment, the patient has consented to  Procedure(s): BREAST LUMPECTOMY WITH NEEDLE LOCALIZATION (Left) as a surgical intervention .  The patient's history has been reviewed, patient examined, no change in status, stable for surgery.  I have reviewed the patient's chart and labs.  Left chest marked in the pre procedure room. Questions were answered to the patient's satisfaction.     Herbert Pun

## 2017-09-20 NOTE — Anesthesia Post-op Follow-up Note (Signed)
Anesthesia QCDR form completed.        

## 2017-09-21 LAB — SURGICAL PATHOLOGY

## 2017-11-18 ENCOUNTER — Other Ambulatory Visit: Payer: BLUE CROSS/BLUE SHIELD

## 2017-11-18 ENCOUNTER — Ambulatory Visit: Payer: BLUE CROSS/BLUE SHIELD | Admitting: Oncology

## 2017-11-22 ENCOUNTER — Encounter: Payer: Self-pay | Admitting: Oncology

## 2017-11-22 ENCOUNTER — Other Ambulatory Visit: Payer: Self-pay

## 2017-11-22 ENCOUNTER — Inpatient Hospital Stay (HOSPITAL_BASED_OUTPATIENT_CLINIC_OR_DEPARTMENT_OTHER): Payer: BLUE CROSS/BLUE SHIELD | Admitting: Oncology

## 2017-11-22 ENCOUNTER — Inpatient Hospital Stay: Payer: BLUE CROSS/BLUE SHIELD | Attending: Oncology

## 2017-11-22 VITALS — BP 139/83 | HR 75 | Temp 98.4°F | Resp 16 | Wt 234.6 lb

## 2017-11-22 DIAGNOSIS — D7589 Other specified diseases of blood and blood-forming organs: Secondary | ICD-10-CM

## 2017-11-22 DIAGNOSIS — D696 Thrombocytopenia, unspecified: Secondary | ICD-10-CM

## 2017-11-22 DIAGNOSIS — Z85850 Personal history of malignant neoplasm of thyroid: Secondary | ICD-10-CM

## 2017-11-22 DIAGNOSIS — D72819 Decreased white blood cell count, unspecified: Secondary | ICD-10-CM

## 2017-11-22 DIAGNOSIS — Z79899 Other long term (current) drug therapy: Secondary | ICD-10-CM

## 2017-11-22 DIAGNOSIS — D649 Anemia, unspecified: Secondary | ICD-10-CM

## 2017-11-22 DIAGNOSIS — D61818 Other pancytopenia: Secondary | ICD-10-CM | POA: Insufficient documentation

## 2017-11-22 LAB — COMPREHENSIVE METABOLIC PANEL
ALK PHOS: 60 U/L (ref 38–126)
ALT: 35 U/L (ref 0–44)
AST: 44 U/L — ABNORMAL HIGH (ref 15–41)
Albumin: 3.9 g/dL (ref 3.5–5.0)
Anion gap: 8 (ref 5–15)
BILIRUBIN TOTAL: 1.4 mg/dL — AB (ref 0.3–1.2)
BUN: 22 mg/dL (ref 8–23)
CALCIUM: 9.3 mg/dL (ref 8.9–10.3)
CO2: 26 mmol/L (ref 22–32)
CREATININE: 1.03 mg/dL — AB (ref 0.44–1.00)
Chloride: 105 mmol/L (ref 98–111)
GFR calc non Af Amer: 56 mL/min — ABNORMAL LOW (ref >60)
Glucose, Bld: 102 mg/dL — ABNORMAL HIGH (ref 70–99)
Potassium: 3.7 mmol/L (ref 3.5–5.1)
SODIUM: 139 mmol/L (ref 135–145)
TOTAL PROTEIN: 6.6 g/dL (ref 6.5–8.1)

## 2017-11-22 LAB — CBC WITH DIFFERENTIAL/PLATELET
Basophils Absolute: 0 10*3/uL (ref 0–0.1)
Basophils Relative: 1 %
Eosinophils Absolute: 0.1 10*3/uL (ref 0–0.7)
Eosinophils Relative: 2 %
HEMATOCRIT: 36.5 % (ref 35.0–47.0)
HEMOGLOBIN: 12.3 g/dL (ref 12.0–16.0)
LYMPHS ABS: 0.9 10*3/uL — AB (ref 1.0–3.6)
LYMPHS PCT: 21 %
MCH: 35.7 pg — AB (ref 26.0–34.0)
MCHC: 33.8 g/dL (ref 32.0–36.0)
MCV: 105.8 fL — AB (ref 80.0–100.0)
MONOS PCT: 11 %
Monocytes Absolute: 0.5 10*3/uL (ref 0.2–0.9)
NEUTROS ABS: 2.9 10*3/uL (ref 1.4–6.5)
Neutrophils Relative %: 67 %
Platelets: 159 10*3/uL (ref 150–440)
RBC: 3.45 MIL/uL — AB (ref 3.80–5.20)
RDW: 13.4 % (ref 11.5–14.5)
WBC: 4.4 10*3/uL (ref 3.6–11.0)

## 2017-11-22 NOTE — Progress Notes (Signed)
Patient here today for follow up.   

## 2017-11-22 NOTE — Progress Notes (Signed)
Hematology/Oncology Consult note [facility] [phone] [phone]   Patient Care Team: Adin Hector, MD as PCP - General (Internal Medicine) Christene Lye, MD (General Surgery)  REFERRING PROVIDER: Adin Hector, MD  REASON FOR VISIT Follow up for treatment of pancytopenia.  HISTORY OF PRESENTING ILLNESS:  Maureen Brown is a  67 y.o.  female with PMH listed below who was referred to me for evaluation of pancytopenia.  Patient was referred by Dr. Caryl Comes.  She recently had labs done a April 2019 which showed WBC 3.3, hemoglobin 11.8, MCV 103.8, and platelet count 1 41,000.  Chemistry showed creatinine 1.2, AST 46, ALT is 40, bilirubin 1.2. She has a history of thyroid cancer status post surgery, she takes Synthroid and TS H are being monitored by endocrinologist. Patient reports easy bruising, denies any hemoptysis, epistaxis, hematochezia or hematemesis. Feels chronically tired.  She has a part-time job at [employer] and remains busy doing house chore at home as well.  Lives with husband.  Social alcohol use. Denies any herbal supplementation Her lab records at [facility] clinic back in 2015 showed WBC 3.9, macrocytosis MCV with 100.0, platelets 1 60,000 History of kidney stone follows up with urology.  She had an ultrasound abdomen complete which showed normal size of spleen, bilateral known obstructive nephrolithiasis.   INTERVAL HISTORY Maureen Brown is a 67 y.o. female who has above history reviewed by me today presents for follow up visit for management of pancytopenia.   # Reports feeling well, no new complaints.  She takes Dapson for dermatology condition. Per patient, She went to beach and got bug bites. After that she often break out with rashes and her dermatologist decided to put her on long term Dapson.   No B symptoms. Fatigue is chronic.   # She had breast biopsy on 08/26/2017 which showed intraductal  papilloma. 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 Fibrotic changes.      Review of Systems  Constitutional: Positive for malaise/fatigue. Negative for chills, fever and weight loss.  HENT: Negative for congestion, ear discharge, ear pain, nosebleeds, sinus pain and sore throat.   Eyes: Negative for double vision, photophobia, pain, discharge and redness.  Respiratory: Negative for cough, hemoptysis, sputum production, shortness of breath and wheezing.   Cardiovascular: Negative for chest pain, palpitations, orthopnea, claudication and leg swelling.  Gastrointestinal: Negative for abdominal pain, blood in stool, constipation, diarrhea, heartburn, melena, nausea and vomiting.  Genitourinary: Negative for dysuria, flank pain, frequency and hematuria.  Musculoskeletal: Negative for back pain, myalgias and neck pain.  Skin: Negative for itching and rash.  Neurological: Negative for dizziness, tingling, tremors, focal weakness, weakness and headaches.  Endo/Heme/Allergies: Negative for environmental allergies. Does not bruise/bleed easily.  Psychiatric/Behavioral: Negative for depression and hallucinations. The patient is not nervous/anxious.     MEDICAL HISTORY:  Past Medical History:  Diagnosis Date  . Anemia   . Anxiety   . Arthritis   . Asthma 1989  . Cancer ([place]) 1989   thyroid  . Chest pain   . Cystitis, unspecified   . Depression   . GERD (gastroesophageal reflux disease)   . Grief reaction 2008  . Herpes labialis   . History of kidney stones   . Hypothyroidism   . Insomnia   . Kidney stone 2014, 2018  . Leg edema   . Leukopenia   . Pleural effusion   . Pneumonia   . 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 Stress incontinence   . Varicose veins of lower extremities with other complications   . Vitamin B 12 deficiency     SURGICAL HISTORY: Past Surgical History:  Procedure Laterality Date  .  ABDOMINAL HYSTERECTOMY  1983  . ABLATION SAPHENOUS VEIN W/ RFA Left 09/2012   [facility]  . APPENDECTOMY    . BREAST BIOPSY Left 1973   neg  . BREAST BIOPSY Left 08/26/2017   Affirm Bx- path pending- X clip  . BREAST CYST REMOVAL    . BREAST EXCISIONAL BIOPSY Left 09/20/2017   path pending  . BREAST LUMPECTOMY WITH NEEDLE LOCALIZATION Left 09/20/2017   Procedure: BREAST LUMPECTOMY WITH NEEDLE LOCALIZATION;  Surgeon: Herbert Pun, MD;  Location: [facility] ORS;  Service: General;  Laterality: Left;  . CHOLECYSTECTOMY  2002  . COLONOSCOPY  2009   Dr. Vira Agar  . COLONOSCOPY WITH PROPOFOL N/A 09/30/2015   Procedure: COLONOSCOPY WITH PROPOFOL;  Surgeon: Manya Silvas, MD;  Location: [facility] ENDOSCOPY;  Service: Endoscopy;  Laterality: N/A;  . KIDNEY STONE SURGERY  2014  . KIDNEY STONE SURGERY  06/27/13, 07-09-16   Dr Jacqlyn Larsen  . KNEE SURGERY Right 1989  . THROAT SURGERY  2012   vocal cord surgery  . THYROID SURGERY  1988  . TONSILLECTOMY    . TOTAL THYROIDECTOMY  2011  . UPPER GI ENDOSCOPY  2013    SOCIAL HISTORY: Social History   Socioeconomic History  . Marital status: Married    Spouse name: Not on file  . Number of children: Not on file  . Years of education: Not on file  . Highest education level: Not on file  Occupational History  . Not on file  Social Needs  . Financial resource strain: Not on file  . Food insecurity:    Worry: Not on file    Inability: Not on file  . Transportation needs:    Medical: Not on file    Non-medical: Not on file  Tobacco Use  . Smoking status: Never Smoker  . Smokeless tobacco: Never Used  Substance and Sexual Activity  . Alcohol use: Yes    Comment: occassional  . Drug use: No  . Sexual activity: Not on file  Lifestyle  . Physical activity:    Days per week: Not on file    Minutes per session: Not on file  . Stress: Not on file  Relationships  . Social connections:    Talks on phone: Not on file    Gets together: Not on file     Attends religious service: Not on file    Active member of club or organization: Not on file    Attends meetings of clubs or organizations: Not on file    Relationship status: Not on file  . Intimate partner violence:    Fear of current or ex partner: Not on file    Emotionally abused: Not on file    Physically abused: Not on file    Forced sexual activity: Not on file  Other Topics Concern  . Not on file  Social History Narrative  . Not on file    FAMILY HISTORY: Family History  Problem Relation Age of Onset  . Breast cancer Maternal Grandmother   . Breast cancer Maternal Aunt   . Thyroid cancer Paternal Grandmother   . Heart disease Mother   . Heart disease Father     ALLERGIES:  is allergic to codeine; hydrochlorothiazide; sulfa antibiotics; advair diskus [fluticasone-salmeterol]; augmentin [  amoxicillin-pot clavulanate]; cephalosporins; ciprofloxacin; demerol [meperidine]; keflex [cephalexin]; and morphine and related.  MEDICATIONS:  Current Outpatient Medications  Medication Sig Dispense Refill  . ALPRAZolam (XANAX) 0.25 MG tablet Take 0.25 mg by mouth every 6 (six) hours as needed for anxiety.     . calcitRIOL (ROCALTROL) 0.5 MCG capsule Take 2 mcg by mouth daily.     . Cyanocobalamin (VITAMIN B 12 PO) Take 1,000 mcg by mouth every other day.     . dapsone 25 MG tablet Take 50 mg by mouth 2 (two) times daily.     . folic acid (FOLVITE) 1 MG tablet Take 1 mg by mouth daily.    . furosemide (LASIX) 20 MG tablet Take 20 mg by mouth daily as needed.    . levalbuterol (XOPENEX HFA) 45 MCG/ACT inhaler Inhale 2 puffs into the lungs 2 (two) times daily as needed for wheezing.     Marland Kitchen levothyroxine (SYNTHROID, LEVOTHROID) 150 MCG tablet Take 150 mcg by mouth daily before breakfast.    . potassium citrate (UROCIT-K) 10 MEQ (1080 MG) SR tablet Take 20 mEq by mouth daily.     Marland Kitchen pyridoxine (B-6) 100 MG tablet Take 100 mg by mouth daily.    . tamsulosin (FLOMAX) 0.4 MG CAPS capsule Take  0.4 mg by mouth daily.    Marland Kitchen tiZANidine (ZANAFLEX) 4 MG tablet Take 4 mg by mouth every 8 (eight) hours as needed for muscle spasms.     . valACYclovir (VALTREX) 1000 MG tablet Take 1,000 mg by mouth 2 (two) times daily as needed.     . VENTOLIN HFA 108 (90 BASE) MCG/ACT inhaler Inhale 1 puff into the lungs every 6 (six) hours as needed for wheezing or shortness of breath.     . ferrous gluconate (FERGON) 240 (27 FE) MG tablet Take 1 tablet by mouth as needed.    . indapamide (LOZOL) 1.25 MG tablet Take 0.625 mg by mouth daily.      No current facility-administered medications for this visit.      PHYSICAL EXAMINATION: ECOG PERFORMANCE STATUS: 0 - Asymptomatic Vitals:   11/22/17 1410  BP: 139/83  Pulse: 75  Resp: 16  Temp: 98.4 F (36.9 C)   Filed Weights   11/22/17 1410  Weight: 234 lb 9 oz (106.4 kg)    Physical Exam  Constitutional: She is oriented to person, place, and time. She appears well-developed and well-nourished. No distress.  HENT:  Head: Normocephalic and atraumatic.  Right Ear: External ear normal.  Left Ear: External ear normal.  Mouth/Throat: Oropharynx is clear and moist.  Eyes: Pupils are equal, round, and reactive to light. Conjunctivae and EOM are normal. No scleral icterus.  Neck: Normal range of motion. Neck supple.  Cardiovascular: Normal rate, regular rhythm and normal heart sounds.  Pulmonary/Chest: Effort normal and breath sounds normal. No respiratory distress. She has no wheezes. She has no rales. She exhibits no tenderness.  Abdominal: Soft. Bowel sounds are normal. She exhibits no distension and no mass. There is no tenderness.  Musculoskeletal: Normal range of motion. She exhibits no edema or deformity.  Lymphadenopathy:    She has no cervical adenopathy.  Neurological: She is alert and oriented to person, place, and time. No cranial nerve deficit. Coordination normal.  Skin: Skin is warm and dry. No rash noted. No erythema.  Psychiatric: She  has a normal mood and affect. Her behavior is normal. Thought content normal.     LABORATORY DATA:  I have reviewed the data  as listed Lab Results  Component Value Date   WBC 4.4 11/22/2017   HGB 12.3 11/22/2017   HCT 36.5 11/22/2017   MCV 105.8 (H) 11/22/2017   PLT 159 11/22/2017   Recent Labs    08/04/17 1024 08/18/17 1036 11/22/17 1352  NA 139  --  139  K 3.4*  --  3.7  CL 104  --  105  CO2 25  --  26  GLUCOSE 105*  --  102*  BUN 24*  --  22  CREATININE 1.26*  --  1.03*  CALCIUM 10.0  --  9.3  GFRNONAA 44*  --  56*  GFRAA 51*  --  >60  PROT 7.4  --  6.6  ALBUMIN 4.1  --  3.9  AST 54*  --  44*  ALT 44  --  35  ALKPHOS 47  --  60  BILITOT 1.4* 1.6* 1.4*  BILIDIR  --  0.3  --        ASSESSMENT & PLAN:  1. Macrocytosis without anemia   2. Leukopenia, unspecified type   3. Hyperbilirubinemia    # Repeat labs reviewed.  Her thrombocytopenia has resolved. Hemoglobin stable and normal.  Chronic lymphocytopenia unchanged.   Persistent macrocytosis, normal Folate and vitamin B12 levels. I think this is most likely due to Dapson [ hemolysis].  Continue to monitor.  Hemoglobin normal and stable.  Recommend patient to continue follow up with primary care physician as scheduled.  She can return in 1 year for re-evaluation.   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 . We spent sufficient time to discuss many aspect of care, questions were answered to patient's satisfaction.  Return of visit: 12 months.       Earlie Server, MD, PhD Hematology Oncology [facility] at [facility] Pager- [phone] 11/22/2017

## 2018-06-20 ENCOUNTER — Other Ambulatory Visit: Payer: Self-pay | Admitting: General Surgery

## 2018-06-20 DIAGNOSIS — D242 Benign neoplasm of left breast: Secondary | ICD-10-CM

## 2018-06-23 ENCOUNTER — Other Ambulatory Visit: Payer: Self-pay | Admitting: General Surgery

## 2018-06-23 DIAGNOSIS — D242 Benign neoplasm of left breast: Secondary | ICD-10-CM

## 2018-07-29 DIAGNOSIS — N183 Chronic kidney disease, stage 3 unspecified: Secondary | ICD-10-CM | POA: Insufficient documentation

## 2018-08-23 ENCOUNTER — Other Ambulatory Visit: Payer: BLUE CROSS/BLUE SHIELD

## 2018-08-23 ENCOUNTER — Other Ambulatory Visit: Payer: Self-pay

## 2018-08-23 ENCOUNTER — Ambulatory Visit
Admission: RE | Admit: 2018-08-23 | Discharge: 2018-08-23 | Disposition: A | Discharge status: Home / Self Care | Payer: Medicare Other | Source: Ambulatory Visit | Attending: General Surgery | Admitting: General Surgery

## 2018-08-23 DIAGNOSIS — D242 Benign neoplasm of left breast: Secondary | ICD-10-CM

## 2018-08-23 DIAGNOSIS — Z1231 Encounter for screening mammogram for malignant neoplasm of breast: Secondary | ICD-10-CM | POA: Diagnosis present

## 2018-11-22 ENCOUNTER — Other Ambulatory Visit: Payer: Self-pay

## 2018-11-23 ENCOUNTER — Encounter: Payer: Self-pay | Admitting: Oncology

## 2018-11-23 ENCOUNTER — Inpatient Hospital Stay: Payer: Medicare Other | Attending: Oncology

## 2018-11-23 ENCOUNTER — Inpatient Hospital Stay (HOSPITAL_BASED_OUTPATIENT_CLINIC_OR_DEPARTMENT_OTHER): Payer: Medicare Other | Admitting: Oncology

## 2018-11-23 ENCOUNTER — Other Ambulatory Visit: Payer: Self-pay

## 2018-11-23 VITALS — BP 140/75 | HR 65 | Temp 98.1°F | Ht 70.0 in | Wt 221.0 lb

## 2018-11-23 DIAGNOSIS — E538 Deficiency of other specified B group vitamins: Secondary | ICD-10-CM | POA: Diagnosis not present

## 2018-11-23 DIAGNOSIS — J45909 Unspecified asthma, uncomplicated: Secondary | ICD-10-CM | POA: Insufficient documentation

## 2018-11-23 DIAGNOSIS — D539 Nutritional anemia, unspecified: Secondary | ICD-10-CM

## 2018-11-23 DIAGNOSIS — E039 Hypothyroidism, unspecified: Secondary | ICD-10-CM | POA: Insufficient documentation

## 2018-11-23 DIAGNOSIS — G4700 Insomnia, unspecified: Secondary | ICD-10-CM | POA: Insufficient documentation

## 2018-11-23 DIAGNOSIS — Z803 Family history of malignant neoplasm of breast: Secondary | ICD-10-CM | POA: Diagnosis not present

## 2018-11-23 DIAGNOSIS — R5383 Other fatigue: Secondary | ICD-10-CM | POA: Insufficient documentation

## 2018-11-23 DIAGNOSIS — C73 Malignant neoplasm of thyroid gland: Secondary | ICD-10-CM | POA: Insufficient documentation

## 2018-11-23 DIAGNOSIS — Z90710 Acquired absence of both cervix and uterus: Secondary | ICD-10-CM | POA: Insufficient documentation

## 2018-11-23 DIAGNOSIS — Z85850 Personal history of malignant neoplasm of thyroid: Secondary | ICD-10-CM | POA: Diagnosis not present

## 2018-11-23 DIAGNOSIS — D7589 Other specified diseases of blood and blood-forming organs: Secondary | ICD-10-CM

## 2018-11-23 DIAGNOSIS — F419 Anxiety disorder, unspecified: Secondary | ICD-10-CM | POA: Insufficient documentation

## 2018-11-23 DIAGNOSIS — E892 Postprocedural hypoparathyroidism: Secondary | ICD-10-CM | POA: Insufficient documentation

## 2018-11-23 DIAGNOSIS — R600 Localized edema: Secondary | ICD-10-CM | POA: Insufficient documentation

## 2018-11-23 DIAGNOSIS — Z79899 Other long term (current) drug therapy: Secondary | ICD-10-CM | POA: Insufficient documentation

## 2018-11-23 DIAGNOSIS — D61818 Other pancytopenia: Secondary | ICD-10-CM | POA: Insufficient documentation

## 2018-11-23 DIAGNOSIS — Z862 Personal history of diseases of the blood and blood-forming organs and certain disorders involving the immune mechanism: Secondary | ICD-10-CM | POA: Insufficient documentation

## 2018-11-23 DIAGNOSIS — E890 Postprocedural hypothyroidism: Secondary | ICD-10-CM | POA: Insufficient documentation

## 2018-11-23 DIAGNOSIS — J90 Pleural effusion, not elsewhere classified: Secondary | ICD-10-CM | POA: Insufficient documentation

## 2018-11-23 LAB — CBC WITH DIFFERENTIAL/PLATELET
Abs Immature Granulocytes: 0.01 10*3/uL (ref 0.00–0.07)
Basophils Absolute: 0 10*3/uL (ref 0.0–0.1)
Basophils Relative: 0 %
Eosinophils Absolute: 0 10*3/uL (ref 0.0–0.5)
Eosinophils Relative: 1 %
HCT: 33.4 % — ABNORMAL LOW (ref 36.0–46.0)
Hemoglobin: 11.2 g/dL — ABNORMAL LOW (ref 12.0–15.0)
Immature Granulocytes: 0 %
Lymphocytes Relative: 23 %
Lymphs Abs: 1.1 10*3/uL (ref 0.7–4.0)
MCH: 35.3 pg — ABNORMAL HIGH (ref 26.0–34.0)
MCHC: 33.5 g/dL (ref 30.0–36.0)
MCV: 105.4 fL — ABNORMAL HIGH (ref 80.0–100.0)
Monocytes Absolute: 0.5 10*3/uL (ref 0.1–1.0)
Monocytes Relative: 10 %
Neutro Abs: 3.1 10*3/uL (ref 1.7–7.7)
Neutrophils Relative %: 66 %
Platelets: 153 10*3/uL (ref 150–400)
RBC: 3.17 MIL/uL — ABNORMAL LOW (ref 3.87–5.11)
RDW: 12.6 % (ref 11.5–15.5)
WBC: 4.8 10*3/uL (ref 4.0–10.5)
nRBC: 0 % (ref 0.0–0.2)

## 2018-11-23 LAB — RETIC PANEL
Immature Retic Fract: 5.9 % (ref 2.3–15.9)
RBC.: 3.17 MIL/uL — ABNORMAL LOW (ref 3.87–5.11)
Retic Count, Absolute: 74.2 10*3/uL (ref 19.0–186.0)
Retic Ct Pct: 2.3 % (ref 0.4–3.1)
Reticulocyte Hemoglobin: 38.9 pg (ref >27.9)

## 2018-11-24 NOTE — Progress Notes (Signed)
Hematology/Oncology Follow Up Note [facility] [phone] Fax:([phone]   Patient Care Team: Adin Hector, MD as PCP - General (Internal Medicine) Christene Lye, MD (General Surgery)  REFERRING PROVIDER: Adin Hector, MD  REASON FOR VISIT Follow up for treatment of pancytopenia.  HISTORY OF PRESENTING ILLNESS:  Maureen Brown is a  68 y.o.  female with PMH listed below who was referred to me for evaluation of pancytopenia.  Patient was referred by Dr. Caryl Comes.  She recently had labs done a April 2019 which showed WBC 3.3, hemoglobin 11.8, MCV 103.8, and platelet count 1 41,000.  Chemistry showed creatinine 1.2, AST 46, ALT is 40, bilirubin 1.2. She has a history of thyroid cancer status post surgery, she takes Synthroid and TS H are being monitored by endocrinologist. Patient reports easy bruising, denies any hemoptysis, epistaxis, hematochezia or hematemesis. Feels chronically tired.  She has a part-time job at [employer] and remains busy doing house chore at home as well.  Lives with husband.  Social alcohol use. Denies any herbal supplementation Her lab records at [facility] clinic back in 2015 showed WBC 3.9, macrocytosis MCV with 100.0, platelets 1 60,000 History of kidney stone follows up with urology.  She had an ultrasound abdomen complete which showed normal size of spleen, bilateral known obstructive nephrolithiasis.  #  # She had breast biopsy on 08/26/2017 which showed intraductal papilloma. 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 Fibrotic changes.  INTERVAL HISTORY Maureen Brown is a 68 y.o. female who has above history reviewed by me today presents for follow up visit for management of pancytopenia.  Patient reports feeling well at baseline.  No new complaints. She uses long-term dapsone chronically for dermatology problems. Denies any constitutional  symptoms. Fatigue is chronic.   Review of Systems  Constitutional: Positive for fatigue. Negative for appetite change, chills and fever.  HENT:   Negative for hearing loss and voice change.   Eyes: Negative for eye problems.  Respiratory: Negative for chest tightness and cough.   Cardiovascular: Negative for chest pain.  Gastrointestinal: Negative for abdominal distention, abdominal pain and blood in stool.  Endocrine: Negative for hot flashes.  Genitourinary: Negative for difficulty urinating and frequency.   Musculoskeletal: Negative for arthralgias.  Skin: Negative for itching and rash.  Neurological: Negative for extremity weakness.  Hematological: Negative for adenopathy.  Psychiatric/Behavioral: Negative for confusion.   MEDICAL HISTORY:  Past Medical History:  Diagnosis Date  . Anemia   . Anxiety   . Arthritis   . Asthma 1989  . Cancer ([place]) 1989   thyroid  . Chest pain   . Cystitis, unspecified   . Depression   . GERD (gastroesophageal reflux disease)   . Grief reaction 2008  . Herpes labialis   . History of kidney stones   . Hypothyroidism   . Insomnia   . Kidney stone 2014, 2018  . Leg edema   . Leukopenia   . Pleural effusion   . Pneumonia   . 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 Stress incontinence   . Varicose veins of lower extremities with other complications   . Vitamin B 12 deficiency     SURGICAL HISTORY: Past Surgical History:  Procedure Laterality Date  . ABDOMINAL HYSTERECTOMY  1983  . ABLATION SAPHENOUS VEIN W/ RFA Left 09/2012   [facility]  . APPENDECTOMY    . BREAST BIOPSY Right 1973   neg  . BREAST  BIOPSY Left 08/26/2017   Papilloma- X clip  . BREAST CYST REMOVAL    . BREAST EXCISIONAL BIOPSY Left 09/20/2017   papalloma  . BREAST LUMPECTOMY WITH NEEDLE LOCALIZATION Left 09/20/2017   Procedure: BREAST LUMPECTOMY WITH NEEDLE LOCALIZATION;  Surgeon: Herbert Pun, MD;  Location: [facility] ORS;  Service: General;  Laterality: Left;  .  CHOLECYSTECTOMY  2002  . COLONOSCOPY  2009   Dr. Vira Agar  . COLONOSCOPY WITH PROPOFOL N/A 09/30/2015   Procedure: COLONOSCOPY WITH PROPOFOL;  Surgeon: Manya Silvas, MD;  Location: [facility] ENDOSCOPY;  Service: Endoscopy;  Laterality: N/A;  . KIDNEY STONE SURGERY  2014  . KIDNEY STONE SURGERY  06/27/13, 07-09-16   Dr Jacqlyn Larsen  . KNEE SURGERY Right 1989  . THROAT SURGERY  2012   vocal cord surgery  . THYROID SURGERY  1988  . TONSILLECTOMY    . TOTAL THYROIDECTOMY  2011  . UPPER GI ENDOSCOPY  2013    SOCIAL HISTORY: Social History   Socioeconomic History  . Marital status: Married    Spouse name: Not on file  . Number of children: Not on file  . Years of education: Not on file  . Highest education level: Not on file  Occupational History  . Not on file  Social Needs  . Financial resource strain: Not on file  . Food insecurity    Worry: Not on file    Inability: Not on file  . Transportation needs    Medical: Not on file    Non-medical: Not on file  Tobacco Use  . Smoking status: Never Smoker  . Smokeless tobacco: Never Used  Substance and Sexual Activity  . Alcohol use: Yes    Comment: occassional  . Drug use: No  . Sexual activity: Not on file  Lifestyle  . Physical activity    Days per week: Not on file    Minutes per session: Not on file  . Stress: Not on file  Relationships  . Social Herbalist on phone: Not on file    Gets together: Not on file    Attends religious service: Not on file    Active member of club or organization: Not on file    Attends meetings of clubs or organizations: Not on file    Relationship status: Not on file  . Intimate partner violence    Fear of current or ex partner: Not on file    Emotionally abused: Not on file    Physically abused: Not on file    Forced sexual activity: Not on file  Other Topics Concern  . Not on file  Social History Narrative  . Not on file    FAMILY HISTORY: Family History  Problem Relation Age  of Onset  . Breast cancer Maternal Grandmother   . Breast cancer Maternal Aunt   . Thyroid cancer Paternal Grandmother   . Heart disease Mother   . Heart disease Father     ALLERGIES:  is allergic to codeine; hydrochlorothiazide; sulfa antibiotics; advair diskus [fluticasone-salmeterol]; augmentin [amoxicillin-pot clavulanate]; cephalosporins; ciprofloxacin; demerol [meperidine]; keflex [cephalexin]; and morphine and related.  MEDICATIONS:  Current Outpatient Medications  Medication Sig Dispense Refill  . ALPRAZolam (XANAX) 0.25 MG tablet Take 0.25 mg by mouth every 6 (six) hours as needed for anxiety.     . calcitRIOL (ROCALTROL) 0.5 MCG capsule Take 2 mcg by mouth daily.     . Cyanocobalamin (VITAMIN B 12 PO) Take 1,000 mcg by mouth every other day.     Marland Kitchen  dapsone 25 MG tablet Take 50 mg by mouth 2 (two) times daily.     . ferrous gluconate (FERGON) 240 (27 FE) MG tablet Take 1 tablet by mouth as needed.    . folic acid (FOLVITE) 1 MG tablet Take 1 mg by mouth daily.    . furosemide (LASIX) 20 MG tablet Take 20 mg by mouth daily as needed.    . indapamide (LOZOL) 1.25 MG tablet Take 0.625 mg by mouth daily.     Marland Kitchen levalbuterol (XOPENEX HFA) 45 MCG/ACT inhaler Inhale 2 puffs into the lungs 2 (two) times daily as needed for wheezing.     Marland Kitchen levothyroxine (SYNTHROID, LEVOTHROID) 150 MCG tablet Take 150 mcg by mouth daily before breakfast.    . potassium citrate (UROCIT-K) 10 MEQ (1080 MG) SR tablet Take 20 mEq by mouth daily.     Marland Kitchen pyridoxine (B-6) 100 MG tablet Take 100 mg by mouth daily.    . tamsulosin (FLOMAX) 0.4 MG CAPS capsule Take 0.4 mg by mouth daily.    Marland Kitchen tiZANidine (ZANAFLEX) 4 MG tablet Take 4 mg by mouth every 8 (eight) hours as needed for muscle spasms.     . valACYclovir (VALTREX) 1000 MG tablet Take 1,000 mg by mouth 2 (two) times daily as needed.     . VENTOLIN HFA 108 (90 BASE) MCG/ACT inhaler Inhale 1 puff into the lungs every 6 (six) hours as needed for wheezing or  shortness of breath.      No current facility-administered medications for this visit.      PHYSICAL EXAMINATION: ECOG PERFORMANCE STATUS: 0 - Asymptomatic Vitals:   11/23/18 1348  BP: 140/75  Pulse: 65  Temp: 98.1 F (36.7 C)   Filed Weights   11/23/18 1348  Weight: 221 lb (100.2 kg)    Physical Exam Constitutional:      General: She is not in acute distress.    Appearance: She is well-developed. She is obese.  HENT:     Head: Normocephalic and atraumatic.     Right Ear: External ear normal.     Left Ear: External ear normal.  Eyes:     General: No scleral icterus.    Conjunctiva/sclera: Conjunctivae normal.     Pupils: Pupils are equal, round, and reactive to light.  Neck:     Musculoskeletal: Normal range of motion and neck supple.  Cardiovascular:     Rate and Rhythm: Normal rate and regular rhythm.     Heart sounds: Normal heart sounds.  Pulmonary:     Effort: Pulmonary effort is normal. No respiratory distress.     Breath sounds: Normal breath sounds. No wheezing or rales.  Chest:     Chest wall: No tenderness.  Abdominal:     General: Bowel sounds are normal. There is no distension.     Palpations: Abdomen is soft. There is no mass.     Tenderness: There is no abdominal tenderness.  Musculoskeletal: Normal range of motion.        General: No deformity.  Lymphadenopathy:     Cervical: No cervical adenopathy.  Skin:    General: Skin is warm and dry.     Findings: No erythema or rash.  Neurological:     Mental Status: She is alert and oriented to person, place, and time.     Cranial Nerves: No cranial nerve deficit.     Coordination: Coordination normal.  Psychiatric:        Behavior: Behavior normal.  Thought Content: Thought content normal.      LABORATORY DATA:  I have reviewed the data as listed Lab Results  Component Value Date   WBC 4.8 11/23/2018   HGB 11.2 (L) 11/23/2018   HCT 33.4 (L) 11/23/2018   MCV 105.4 (H) 11/23/2018    PLT 153 11/23/2018   No results for input(s): NA, K, CL, CO2, GLUCOSE, BUN, CREATININE, CALCIUM, GFRNONAA, GFRAA, PROT, ALBUMIN, AST, ALT, ALKPHOS, BILITOT, BILIDIR, IBILI in the last 8760 hours.     ASSESSMENT & PLAN:  1. Macrocytic anemia   Labs are reviewed and discussed with patient. Patient has chronic macrocytosis, previously without anemia. Previously normal folate and vitamin B12 levels. Macrocytosis was felt to be likely secondary to dapsone-which causes hemolysis.  Recommend to continue monitor. Discussed with patient that now she started to have mild anemia with a hemoglobin of 11.2. We discussed about other possibilities of macrocytosis/anemia including MDS etc. I recommend further work-up.  Patient is concerned about the financial cost of blood testing. She is seeing primary care physician Dr. Caryl Comes in [place] this year.  And she will get blood work done there. Recommend patient to follow-up in 6 months.  She will update me if her blood work continues to get worse and she will be seen sooner than 19-month interval.  Reticulocyte panel was added to today CBC and was reviewed.  Normal reticular site hemoglobin, which ruling out iron deficiency.  Follow-up in 6 months.  We spent sufficient time to discuss many aspect of care, questions were answered to patient's satisfaction.  Earlie Server, MD, PhD Hematology Oncology [place] at [facility] 11/24/2018

## 2019-01-06 ENCOUNTER — Other Ambulatory Visit: Payer: Self-pay

## 2019-01-06 DIAGNOSIS — Z20822 Contact with and (suspected) exposure to covid-19: Secondary | ICD-10-CM

## 2019-01-08 LAB — NOVEL CORONAVIRUS, NAA: SARS-CoV-2, NAA: NOT DETECTED

## 2019-01-09 ENCOUNTER — Telehealth: Payer: Self-pay | Admitting: General Practice

## 2019-01-09 NOTE — Telephone Encounter (Signed)
Negative COVID results given. Patient results "NOT Detected." Caller expressed understanding. ° °

## 2019-05-19 ENCOUNTER — Other Ambulatory Visit: Payer: Self-pay

## 2019-05-19 ENCOUNTER — Inpatient Hospital Stay: Payer: Medicare Other | Attending: Oncology

## 2019-05-19 DIAGNOSIS — M1990 Unspecified osteoarthritis, unspecified site: Secondary | ICD-10-CM | POA: Insufficient documentation

## 2019-05-19 DIAGNOSIS — E039 Hypothyroidism, unspecified: Secondary | ICD-10-CM | POA: Insufficient documentation

## 2019-05-19 DIAGNOSIS — K219 Gastro-esophageal reflux disease without esophagitis: Secondary | ICD-10-CM | POA: Diagnosis not present

## 2019-05-19 DIAGNOSIS — D61818 Other pancytopenia: Secondary | ICD-10-CM | POA: Diagnosis present

## 2019-05-19 DIAGNOSIS — Z79899 Other long term (current) drug therapy: Secondary | ICD-10-CM | POA: Diagnosis not present

## 2019-05-19 DIAGNOSIS — Z87442 Personal history of urinary calculi: Secondary | ICD-10-CM | POA: Insufficient documentation

## 2019-05-19 DIAGNOSIS — F418 Other specified anxiety disorders: Secondary | ICD-10-CM | POA: Insufficient documentation

## 2019-05-19 DIAGNOSIS — D539 Nutritional anemia, unspecified: Secondary | ICD-10-CM | POA: Diagnosis not present

## 2019-05-19 LAB — COMPREHENSIVE METABOLIC PANEL
ALT: 23 U/L (ref 0–44)
AST: 32 U/L (ref 15–41)
Albumin: 4.1 g/dL (ref 3.5–5.0)
Alkaline Phosphatase: 48 U/L (ref 38–126)
Anion gap: 10 (ref 5–15)
BUN: 22 mg/dL (ref 8–23)
CO2: 24 mmol/L (ref 22–32)
Calcium: 9.2 mg/dL (ref 8.9–10.3)
Chloride: 105 mmol/L (ref 98–111)
Creatinine, Ser: 1.34 mg/dL — ABNORMAL HIGH (ref 0.44–1.00)
GFR calc Af Amer: 48 mL/min — ABNORMAL LOW (ref >60)
GFR calc non Af Amer: 41 mL/min — ABNORMAL LOW (ref >60)
Glucose, Bld: 86 mg/dL (ref 70–99)
Potassium: 3.8 mmol/L (ref 3.5–5.1)
Sodium: 139 mmol/L (ref 135–145)
Total Bilirubin: 1.1 mg/dL (ref 0.3–1.2)
Total Protein: 7.2 g/dL (ref 6.5–8.1)

## 2019-05-19 LAB — RETIC PANEL
Immature Retic Fract: 9.5 % (ref 2.3–15.9)
RBC.: 3.19 MIL/uL — ABNORMAL LOW (ref 3.87–5.11)
Retic Count, Absolute: 67.6 10*3/uL (ref 19.0–186.0)
Retic Ct Pct: 2.1 % (ref 0.4–3.1)
Reticulocyte Hemoglobin: 37 pg (ref >27.9)

## 2019-05-19 LAB — CBC WITH DIFFERENTIAL/PLATELET
Abs Immature Granulocytes: 0.01 10*3/uL (ref 0.00–0.07)
Basophils Absolute: 0 10*3/uL (ref 0.0–0.1)
Basophils Relative: 0 %
Eosinophils Absolute: 0 10*3/uL (ref 0.0–0.5)
Eosinophils Relative: 0 %
HCT: 34.5 % — ABNORMAL LOW (ref 36.0–46.0)
Hemoglobin: 11.1 g/dL — ABNORMAL LOW (ref 12.0–15.0)
Immature Granulocytes: 0 %
Lymphocytes Relative: 30 %
Lymphs Abs: 1 10*3/uL (ref 0.7–4.0)
MCH: 34.9 pg — ABNORMAL HIGH (ref 26.0–34.0)
MCHC: 32.2 g/dL (ref 30.0–36.0)
MCV: 108.5 fL — ABNORMAL HIGH (ref 80.0–100.0)
Monocytes Absolute: 0.4 10*3/uL (ref 0.1–1.0)
Monocytes Relative: 11 %
Neutro Abs: 1.9 10*3/uL (ref 1.7–7.7)
Neutrophils Relative %: 59 %
Platelets: 144 10*3/uL — ABNORMAL LOW (ref 150–400)
RBC: 3.18 MIL/uL — ABNORMAL LOW (ref 3.87–5.11)
RDW: 11.6 % (ref 11.5–15.5)
WBC: 3.2 10*3/uL — ABNORMAL LOW (ref 4.0–10.5)
nRBC: 0 % (ref 0.0–0.2)

## 2019-05-19 LAB — VITAMIN B12: Vitamin B-12: 773 pg/mL (ref 180–914)

## 2019-05-19 LAB — FOLATE: Folate: 33 ng/mL (ref >5.9)

## 2019-05-19 LAB — LACTATE DEHYDROGENASE: LDH: 145 U/L (ref 98–192)

## 2019-05-22 ENCOUNTER — Inpatient Hospital Stay (HOSPITAL_BASED_OUTPATIENT_CLINIC_OR_DEPARTMENT_OTHER): Payer: Medicare Other | Admitting: Oncology

## 2019-05-22 ENCOUNTER — Other Ambulatory Visit: Payer: PRIVATE HEALTH INSURANCE

## 2019-05-22 ENCOUNTER — Other Ambulatory Visit: Payer: Self-pay

## 2019-05-22 ENCOUNTER — Encounter: Payer: Self-pay | Admitting: Oncology

## 2019-05-22 VITALS — BP 136/81 | HR 67 | Temp 97.8°F | Resp 16 | Wt 230.4 lb

## 2019-05-22 DIAGNOSIS — D539 Nutritional anemia, unspecified: Secondary | ICD-10-CM | POA: Diagnosis not present

## 2019-05-22 DIAGNOSIS — T371X5A Adverse effect of antimycobacterial drugs, initial encounter: Secondary | ICD-10-CM

## 2019-05-22 DIAGNOSIS — D72819 Decreased white blood cell count, unspecified: Secondary | ICD-10-CM | POA: Diagnosis not present

## 2019-05-22 DIAGNOSIS — D61818 Other pancytopenia: Secondary | ICD-10-CM | POA: Diagnosis not present

## 2019-05-22 DIAGNOSIS — D696 Thrombocytopenia, unspecified: Secondary | ICD-10-CM

## 2019-05-22 NOTE — Progress Notes (Signed)
Patient does not offer any problems today.  
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
## 2019-07-21 ENCOUNTER — Other Ambulatory Visit: Payer: Self-pay | Admitting: General Surgery

## 2019-07-21 DIAGNOSIS — Z1231 Encounter for screening mammogram for malignant neoplasm of breast: Secondary | ICD-10-CM

## 2019-08-21 ENCOUNTER — Inpatient Hospital Stay: Payer: Medicare Other | Attending: Oncology

## 2019-08-21 ENCOUNTER — Other Ambulatory Visit: Payer: Self-pay

## 2019-08-21 DIAGNOSIS — Z803 Family history of malignant neoplasm of breast: Secondary | ICD-10-CM | POA: Insufficient documentation

## 2019-08-21 DIAGNOSIS — Z8249 Family history of ischemic heart disease and other diseases of the circulatory system: Secondary | ICD-10-CM | POA: Diagnosis not present

## 2019-08-21 DIAGNOSIS — Z79899 Other long term (current) drug therapy: Secondary | ICD-10-CM | POA: Insufficient documentation

## 2019-08-21 DIAGNOSIS — K219 Gastro-esophageal reflux disease without esophagitis: Secondary | ICD-10-CM | POA: Insufficient documentation

## 2019-08-21 DIAGNOSIS — D61818 Other pancytopenia: Secondary | ICD-10-CM | POA: Insufficient documentation

## 2019-08-21 DIAGNOSIS — J45909 Unspecified asthma, uncomplicated: Secondary | ICD-10-CM | POA: Diagnosis not present

## 2019-08-21 DIAGNOSIS — E039 Hypothyroidism, unspecified: Secondary | ICD-10-CM | POA: Insufficient documentation

## 2019-08-21 DIAGNOSIS — D72819 Decreased white blood cell count, unspecified: Secondary | ICD-10-CM

## 2019-08-21 DIAGNOSIS — Z90710 Acquired absence of both cervix and uterus: Secondary | ICD-10-CM | POA: Insufficient documentation

## 2019-08-21 DIAGNOSIS — Z808 Family history of malignant neoplasm of other organs or systems: Secondary | ICD-10-CM | POA: Diagnosis not present

## 2019-08-21 DIAGNOSIS — F329 Major depressive disorder, single episode, unspecified: Secondary | ICD-10-CM | POA: Diagnosis not present

## 2019-08-21 DIAGNOSIS — Z85850 Personal history of malignant neoplasm of thyroid: Secondary | ICD-10-CM | POA: Insufficient documentation

## 2019-08-21 DIAGNOSIS — T371X5A Adverse effect of antimycobacterial drugs, initial encounter: Secondary | ICD-10-CM

## 2019-08-21 DIAGNOSIS — D696 Thrombocytopenia, unspecified: Secondary | ICD-10-CM

## 2019-08-21 DIAGNOSIS — F419 Anxiety disorder, unspecified: Secondary | ICD-10-CM | POA: Insufficient documentation

## 2019-08-21 DIAGNOSIS — D539 Nutritional anemia, unspecified: Secondary | ICD-10-CM

## 2019-08-21 LAB — COMPREHENSIVE METABOLIC PANEL
ALT: 31 U/L (ref 0–44)
AST: 39 U/L (ref 15–41)
Albumin: 4 g/dL (ref 3.5–5.0)
Alkaline Phosphatase: 43 U/L (ref 38–126)
Anion gap: 8 (ref 5–15)
BUN: 21 mg/dL (ref 8–23)
CO2: 26 mmol/L (ref 22–32)
Calcium: 9 mg/dL (ref 8.9–10.3)
Chloride: 106 mmol/L (ref 98–111)
Creatinine, Ser: 1.37 mg/dL — ABNORMAL HIGH (ref 0.44–1.00)
GFR calc Af Amer: 46 mL/min — ABNORMAL LOW (ref >60)
GFR calc non Af Amer: 40 mL/min — ABNORMAL LOW (ref >60)
Glucose, Bld: 91 mg/dL (ref 70–99)
Potassium: 3.9 mmol/L (ref 3.5–5.1)
Sodium: 140 mmol/L (ref 135–145)
Total Bilirubin: 1.3 mg/dL — ABNORMAL HIGH (ref 0.3–1.2)
Total Protein: 6.8 g/dL (ref 6.5–8.1)

## 2019-08-21 LAB — CBC WITH DIFFERENTIAL/PLATELET
Abs Immature Granulocytes: 0.01 10*3/uL (ref 0.00–0.07)
Basophils Absolute: 0 10*3/uL (ref 0.0–0.1)
Basophils Relative: 0 %
Eosinophils Absolute: 0 10*3/uL (ref 0.0–0.5)
Eosinophils Relative: 1 %
HCT: 32.8 % — ABNORMAL LOW (ref 36.0–46.0)
Hemoglobin: 11 g/dL — ABNORMAL LOW (ref 12.0–15.0)
Immature Granulocytes: 0 %
Lymphocytes Relative: 26 %
Lymphs Abs: 0.8 10*3/uL (ref 0.7–4.0)
MCH: 36.4 pg — ABNORMAL HIGH (ref 26.0–34.0)
MCHC: 33.5 g/dL (ref 30.0–36.0)
MCV: 108.6 fL — ABNORMAL HIGH (ref 80.0–100.0)
Monocytes Absolute: 0.4 10*3/uL (ref 0.1–1.0)
Monocytes Relative: 11 %
Neutro Abs: 1.9 10*3/uL (ref 1.7–7.7)
Neutrophils Relative %: 62 %
Platelets: 127 10*3/uL — ABNORMAL LOW (ref 150–400)
RBC: 3.02 MIL/uL — ABNORMAL LOW (ref 3.87–5.11)
RDW: 12.6 % (ref 11.5–15.5)
WBC: 3.1 10*3/uL — ABNORMAL LOW (ref 4.0–10.5)
nRBC: 0 % (ref 0.0–0.2)

## 2019-08-22 LAB — GLUCOSE 6 PHOSPHATE DEHYDROGENASE
G6PDH: 10.3 U/g{Hb} (ref 4.8–15.7)
Hemoglobin: 9.5 g/dL — ABNORMAL LOW (ref 11.1–15.9)

## 2019-08-24 ENCOUNTER — Ambulatory Visit
Admission: RE | Admit: 2019-08-24 | Discharge: 2019-08-24 | Disposition: A | Discharge status: Home / Self Care | Payer: Medicare Other | Source: Ambulatory Visit | Attending: General Surgery | Admitting: General Surgery

## 2019-08-24 DIAGNOSIS — Z1231 Encounter for screening mammogram for malignant neoplasm of breast: Secondary | ICD-10-CM | POA: Diagnosis present

## 2019-09-04 ENCOUNTER — Encounter: Payer: Self-pay | Admitting: Oncology

## 2019-09-04 ENCOUNTER — Inpatient Hospital Stay (HOSPITAL_BASED_OUTPATIENT_CLINIC_OR_DEPARTMENT_OTHER): Payer: Medicare Other | Admitting: Oncology

## 2019-09-04 ENCOUNTER — Other Ambulatory Visit: Payer: Self-pay

## 2019-09-04 VITALS — BP 143/65 | HR 65 | Temp 97.9°F | Resp 18 | Wt 228.0 lb

## 2019-09-04 DIAGNOSIS — D696 Thrombocytopenia, unspecified: Secondary | ICD-10-CM | POA: Diagnosis not present

## 2019-09-04 DIAGNOSIS — D539 Nutritional anemia, unspecified: Secondary | ICD-10-CM | POA: Diagnosis not present

## 2019-09-04 DIAGNOSIS — D72819 Decreased white blood cell count, unspecified: Secondary | ICD-10-CM

## 2019-09-04 DIAGNOSIS — D61818 Other pancytopenia: Secondary | ICD-10-CM | POA: Diagnosis not present

## 2019-09-04 NOTE — Progress Notes (Signed)
Pt here for follow up. No new concerns voiced.   

## 2019-09-04 NOTE — Progress Notes (Signed)
Hematology/Oncology Follow Up Note [facility] [phone] Fax:([phone]) [phone]   Patient Care Team: Adin Hector, MD as PCP - General (Internal Medicine) Christene Lye, MD (General Surgery)  REFERRING PROVIDER: Adin Hector, MD  REASON FOR VISIT Follow up for treatment of pancytopenia.  HISTORY OF PRESENTING ILLNESS:  JACQUELYNN FRIEND is a  69 y.o.  female with PMH listed below who was referred to me for evaluation of pancytopenia.  Patient was referred by Dr. Caryl Comes.  She recently had labs done a April 2019 which showed WBC 3.3, hemoglobin 11.8, MCV 103.8, and platelet count 1 41,000.  Chemistry showed creatinine 1.2, AST 46, ALT is 40, bilirubin 1.2. She has a history of thyroid cancer status post surgery, she takes Synthroid and TS H are being monitored by endocrinologist. Patient reports easy bruising, denies any hemoptysis, epistaxis, hematochezia or hematemesis. Feels chronically tired.  She has a part-time job at [employer] and remains busy doing house chore at home as well.  Lives with husband.  Social alcohol use. Denies any herbal supplementation Her lab records at [facility] back in 2015 showed WBC 3.9, macrocytosis MCV with 100.0, platelets 1 60,000 History of kidney stone follows up with urology.  She had an ultrasound abdomen complete which showed normal size of spleen, bilateral known obstructive nephrolithiasis.  #  # She had breast biopsy on 08/26/2017 which showed intraductal papilloma. 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 Fibrotic changes.  INTERVAL HISTORY CTOSHIYE KEVERis a 69y.o. female who has above history reviewed by me today presents for follow up visit for management of pancytopenia.  Patient has no new complaints. She uses long-term dapsone chronically for dermatology problems. Patient denies any constitutional symptoms.  . Review of  Systems  Constitutional: Negative for appetite change, chills, fatigue and fever.  HENT:   Negative for hearing loss and voice change.   Eyes: Negative for eye problems.  Respiratory: Negative for chest tightness and cough.   Cardiovascular: Negative for chest pain.  Gastrointestinal: Negative for abdominal distention, abdominal pain and blood in stool.  Endocrine: Negative for hot flashes.  Genitourinary: Negative for difficulty urinating and frequency.   Musculoskeletal: Negative for arthralgias.  Skin: Negative for itching and rash.  Neurological: Negative for extremity weakness.  Hematological: Negative for adenopathy.  Psychiatric/Behavioral: Negative for confusion.   MEDICAL HISTORY:  Past Medical History:  Diagnosis Date  . Anemia   . Anxiety   . Arthritis   . Asthma 1989  . [place]   [place]  . Chest pain   . Cystitis, unspecified   . Depression   . GERD (gastroesophageal reflux disease)   . Grief reaction 2008  . Herpes labialis   . History of kidney stones   . Hypothyroidism   . Insomnia   . Kidney stone 2014, 2018  . Leg edema   . Leukopenia   . Pleural effusion   . Pneumonia   . Postsurgical hypoparathyroidism ([place]   . Stress incontinence   . Varicose veins of lower extremities with other complications   . Vitamin B 12 deficiency     SURGICAL HISTORY: Past Surgical History:  Procedure Laterality Date  . ABDOMINAL HYSTERECTOMY  1983  . ABLATION SAPHENOUS VEIN W/ RFA Left 09/2012   GSV  . APPENDECTOMY    . BREAST BIOPSY Right 1973   neg  . BREAST BIOPSY Left 08/26/2017   Papilloma- X clip  .  BREAST CYST REMOVAL    . BREAST EXCISIONAL BIOPSY Left 09/20/2017   papalloma  . BREAST LUMPECTOMY WITH NEEDLE LOCALIZATION Left 09/20/2017   Procedure: BREAST LUMPECTOMY WITH NEEDLE LOCALIZATION;  Surgeon: Herbert Pun, MD;  Location: [facility] ORS;  Service: General;  Laterality: Left;  . CHOLECYSTECTOMY  2002  . COLONOSCOPY  2009   Dr. Vira Agar   . COLONOSCOPY WITH PROPOFOL N/A 09/30/2015   Procedure: COLONOSCOPY WITH PROPOFOL;  Surgeon: Manya Silvas, MD;  Location: [facility] ENDOSCOPY;  Service: Endoscopy;  Laterality: N/A;  . KIDNEY STONE SURGERY  2014  . KIDNEY STONE SURGERY  06/27/13, 07-09-16   Dr Jacqlyn Larsen  . KNEE SURGERY Right 1989  . THROAT SURGERY  2012   vocal cord surgery  . THYROID SURGERY  1988  . TONSILLECTOMY    . TOTAL THYROIDECTOMY  2011  . UPPER GI ENDOSCOPY  2013    SOCIAL HISTORY: Social History   Socioeconomic History  . Marital status: Married    Spouse name: Not on file  . Number of children: Not on file  . Years of education: Not on file  . Highest education level: Not on file  Occupational History  . Not on file  Tobacco Use  . Smoking status: Never Smoker  . Smokeless tobacco: Never Used  Vaping Use  . Vaping Use: Never used  Substance and Sexual Activity  . Alcohol use: Yes    Comment: occassional  . Drug use: No  . Sexual activity: Not on file  Other Topics Concern  . Not on file  Social History Narrative  . Not on file   Social Determinants of Health   Financial Resource Strain:   . Difficulty of Paying Living Expenses:   Food Insecurity:   . Worried About Charity fundraiser in the Last Year:   . Arboriculturist in the Last Year:   Transportation Needs:   . Film/video editor (Medical):   Marland Kitchen Lack of Transportation (Non-Medical):   Physical Activity:   . Days of Exercise per Week:   . Minutes of Exercise per Session:   Stress:   . Feeling of Stress :   Social Connections:   . Frequency of Communication with Friends and Family:   . Frequency of Social Gatherings with Friends and Family:   . Attends Religious Services:   . Active Member of Clubs or Organizations:   . Attends Archivist Meetings:   Marland Kitchen Marital Status:   Intimate Partner Violence:   . Fear of Current or Ex-Partner:   . Emotionally Abused:   Marland Kitchen Physically Abused:   . Sexually Abused:     FAMILY  HISTORY: Family History  Problem Relation Age of Onset  . Breast cancer Maternal Grandmother   . Breast cancer Maternal Aunt   . Thyroid cancer Paternal Grandmother   . Heart disease Mother   . Heart disease Father     ALLERGIES:  is allergic to codeine, hydrochlorothiazide, sulfa antibiotics, advair diskus [fluticasone-salmeterol], augmentin [amoxicillin-pot clavulanate], cephalosporins, ciprofloxacin, demerol [meperidine], keflex [cephalexin], and morphine and related.  MEDICATIONS:  Current Outpatient Medications  Medication Sig Dispense Refill  . ALPRAZolam (XANAX) 0.25 MG tablet Take 0.25 mg by mouth every 6 (six) hours as needed for anxiety.     Marland Kitchen azelastine (ASTELIN) 0.1 % nasal spray Place into the nose.    . calcitRIOL (ROCALTROL) 0.5 MCG capsule Take 2 mcg by mouth daily.     . Cholecalciferol 125 MCG (5000 UT) capsule  Take by mouth.    . Cyanocobalamin (VITAMIN B 12 PO) Take 1,000 mcg by mouth every other day.     . dapsone 25 MG tablet Take 50 mg by mouth 2 (two) times daily.     . ferrous gluconate (FERGON) 240 (27 FE) MG tablet Take 1 tablet by mouth as needed.    . folic acid (FOLVITE) 1 MG tablet Take 1 mg by mouth daily.    . furosemide (LASIX) 20 MG tablet Take 20 mg by mouth daily as needed.    . indapamide (LOZOL) 1.25 MG tablet Take 0.625 mg by mouth daily as needed.     . levalbuterol (XOPENEX HFA) 45 MCG/ACT inhaler Inhale 2 puffs into the lungs 2 (two) times daily as needed for wheezing.     . potassium citrate (UROCIT-K) 10 MEQ (1080 MG) SR tablet Take 20 mEq by mouth daily. 1-2 tabs QD    . pyridoxine (B-6) 100 MG tablet Take 100 mg by mouth daily.    Marland Kitchen SYNTHROID 137 MCG tablet 1 tablet Sunday - Friday, 1/2 tab on saturday    . tamsulosin (FLOMAX) 0.4 MG CAPS capsule Take 0.4 mg by mouth daily.    Marland Kitchen tiZANidine (ZANAFLEX) 4 MG tablet Take 4 mg by mouth every 8 (eight) hours as needed for muscle spasms.     . valACYclovir (VALTREX) 1000 MG tablet Take 1,000 mg  by mouth 2 (two) times daily as needed.     . VENTOLIN HFA 108 (90 BASE) MCG/ACT inhaler Inhale 1 puff into the lungs every 6 (six) hours as needed for wheezing or shortness of breath.      No current facility-administered medications for this visit.     PHYSICAL EXAMINATION: ECOG PERFORMANCE STATUS: 0 - Asymptomatic Vitals:   09/04/19 1012  BP: (!) 143/65  Pulse: 65  Resp: 18  Temp: 97.9 F (36.6 C)   Filed Weights   09/04/19 1012  Weight: 228 lb (103.4 kg)    Physical Exam Constitutional:      General: She is not in acute distress.    Appearance: She is well-developed.  HENT:     Head: Normocephalic and atraumatic.     Right Ear: External ear normal.     Left Ear: External ear normal.  Eyes:     General: No scleral icterus.    Conjunctiva/sclera: Conjunctivae normal.     Pupils: Pupils are equal, round, and reactive to light.  Cardiovascular:     Rate and Rhythm: Normal rate and regular rhythm.     Heart sounds: Normal heart sounds.  Pulmonary:     Effort: Pulmonary effort is normal. No respiratory distress.     Breath sounds: Normal breath sounds. No wheezing or rales.  Chest:     Chest wall: No tenderness.  Abdominal:     General: Bowel sounds are normal. There is no distension.     Palpations: Abdomen is soft. There is no mass.     Tenderness: There is no abdominal tenderness.  Musculoskeletal:        General: No deformity. Normal range of motion.     Cervical back: Normal range of motion and neck supple.  Lymphadenopathy:     Cervical: No cervical adenopathy.  Skin:    General: Skin is warm and dry.     Findings: No erythema or rash.  Neurological:     General: No focal deficit present.     Mental Status: She is alert. Mental status is at  baseline.     Cranial Nerves: No cranial nerve deficit.     Coordination: Coordination normal.  Psychiatric:        Mood and Affect: Mood normal.      LABORATORY DATA:  I have reviewed the data as listed Lab  Results  Component Value Date   WBC 3.1 (L) 08/21/2019   HGB 9.5 (L) 08/21/2019   HGB 11.0 (L) 08/21/2019   HCT 32.8 (L) 08/21/2019   MCV 108.6 (H) 08/21/2019   PLT 127 (L) 08/21/2019   Recent Labs    05/19/19 1127 08/21/19 1038  NA 139 140  K 3.8 3.9  CL 105 106  CO2 24 26  GLUCOSE 86 91  BUN 22 21  CREATININE 1.34* 1.37*  CALCIUM 9.2 9.0  GFRNONAA 41* 40*  GFRAA 48* 46*  PROT 7.2 6.8  ALBUMIN 4.1 4.0  AST 32 39  ALT 23 31  ALKPHOS 48 43  BILITOT 1.1 1.3*       ASSESSMENT & PLAN:  1. Macrocytic anemia   2. Thrombocytopenia (HCC)   3. Leukopenia, unspecified type    #Chronic macrocytic anemia, Labs are reviewed and discussed with patient. Patient has a history of normal vitamin B12 and folate level. Macrocytosis may be secondary to dapsone. She has mild bilirubinemia which may be secondary to dapsone induced hemolysis. LDH is normal.  G6PD level is normal. Cannot rule out MDS.  She now has developed leukopenia and thrombocytopenia since 3 months ago.  Repeat blood work showed persistent cytopenia.  Questionable secondary to dapsone versus underlying bone marrow process. Discussed with patient about holding dapsone and obtain bone marrow biopsy.  Patient is not interested in bone marrow biopsy at this point.  I recommend patient to discuss with dermatology about temporarily stop dapsone and see if her counts improved. We spent sufficient time to discuss many aspect of care, questions were answered to patient's satisfaction. Follow-up in 4 months. Earlie Server, MD, PhD Hematology Oncology [place] at [facility] 09/04/2019

## 2019-10-06 ENCOUNTER — Other Ambulatory Visit: Payer: Self-pay | Admitting: Internal Medicine

## 2019-10-06 DIAGNOSIS — R7989 Other specified abnormal findings of blood chemistry: Secondary | ICD-10-CM

## 2019-10-06 DIAGNOSIS — N1831 Chronic kidney disease, stage 3a: Secondary | ICD-10-CM

## 2019-10-06 DIAGNOSIS — R82992 Hyperoxaluria: Secondary | ICD-10-CM

## 2019-10-13 ENCOUNTER — Other Ambulatory Visit: Payer: Self-pay

## 2019-10-13 ENCOUNTER — Ambulatory Visit
Admission: RE | Admit: 2019-10-13 | Discharge: 2019-10-13 | Disposition: A | Discharge status: Home / Self Care | Payer: Medicare Other | Source: Ambulatory Visit | Attending: Internal Medicine | Admitting: Internal Medicine

## 2019-10-13 DIAGNOSIS — R82992 Hyperoxaluria: Secondary | ICD-10-CM | POA: Diagnosis present

## 2019-10-13 DIAGNOSIS — R7989 Other specified abnormal findings of blood chemistry: Secondary | ICD-10-CM | POA: Insufficient documentation

## 2019-10-13 DIAGNOSIS — N1831 Chronic kidney disease, stage 3a: Secondary | ICD-10-CM | POA: Insufficient documentation

## 2019-10-18 IMAGING — MG MM DIGITAL SCREENING BILAT W/ TOMO W/ CAD
6 of 10 series · 6 of 30 positions shown · non-contrast
Comparison: Previous exam(s).

CLINICAL DATA: Screening.

EXAM:
DIGITAL SCREENING BILATERAL MAMMOGRAM WITH TOMO AND CAD

[R MLO synth-2D]
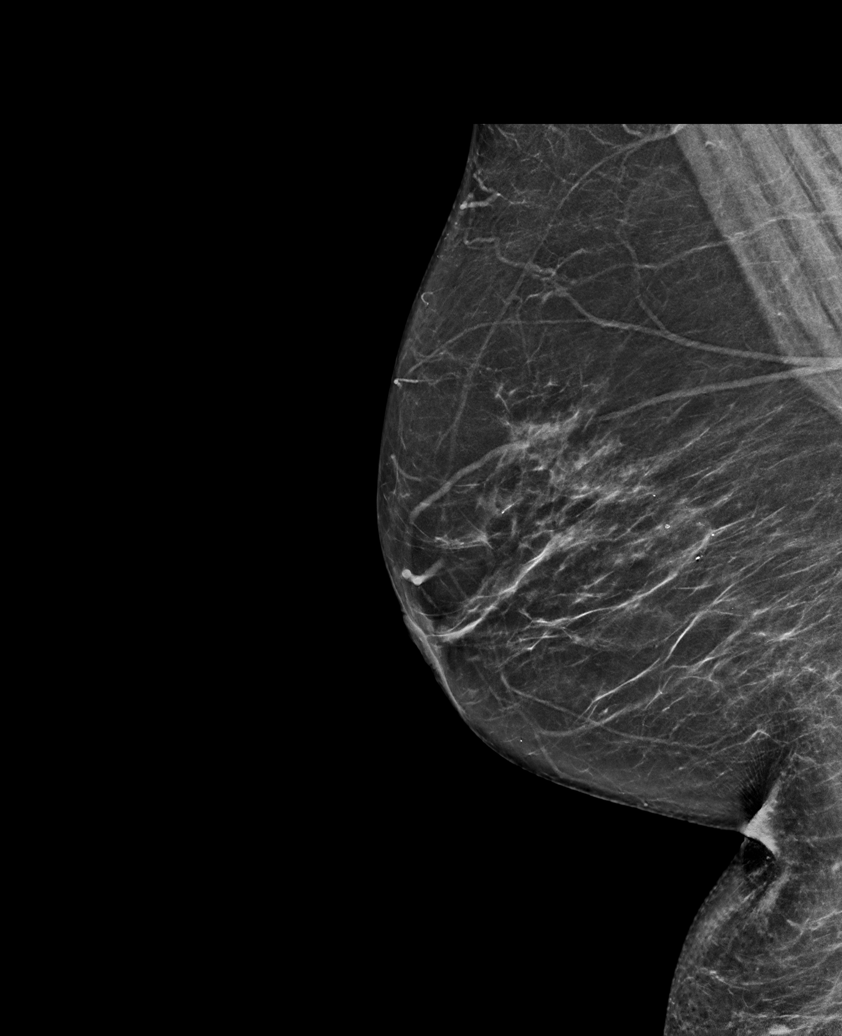

[R CC synth-2D (1 of 2)]
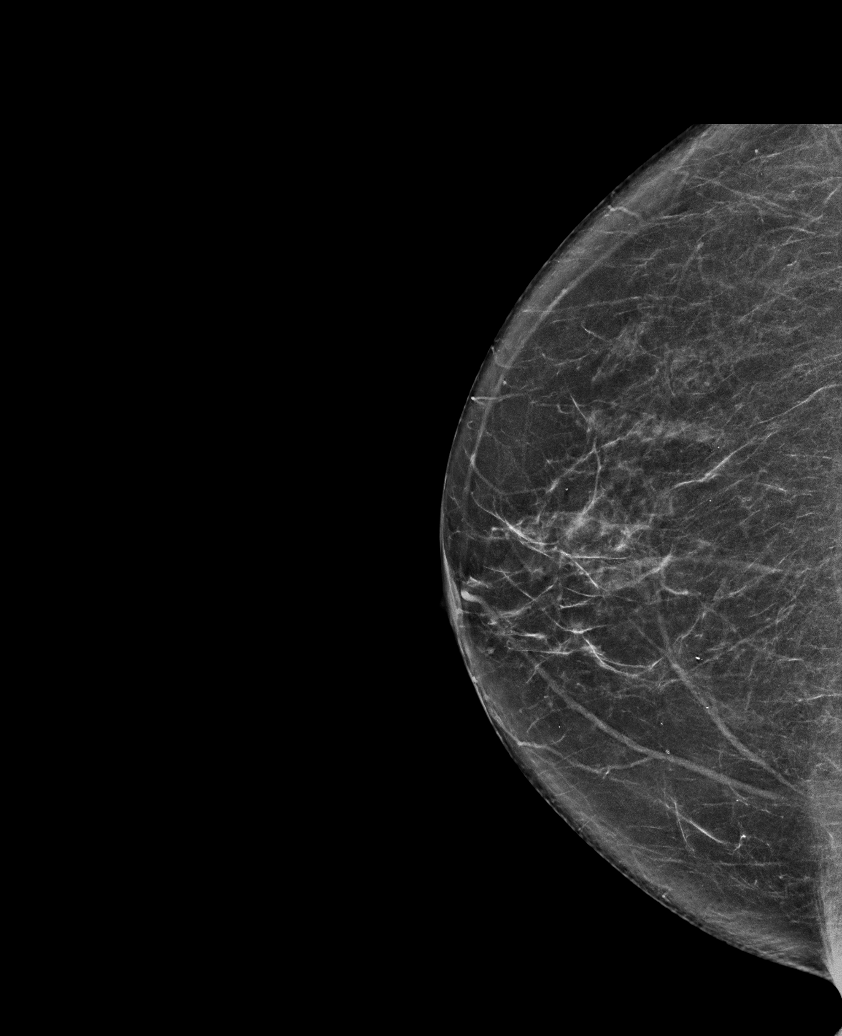

[R CC synth-2D (2 of 2)]
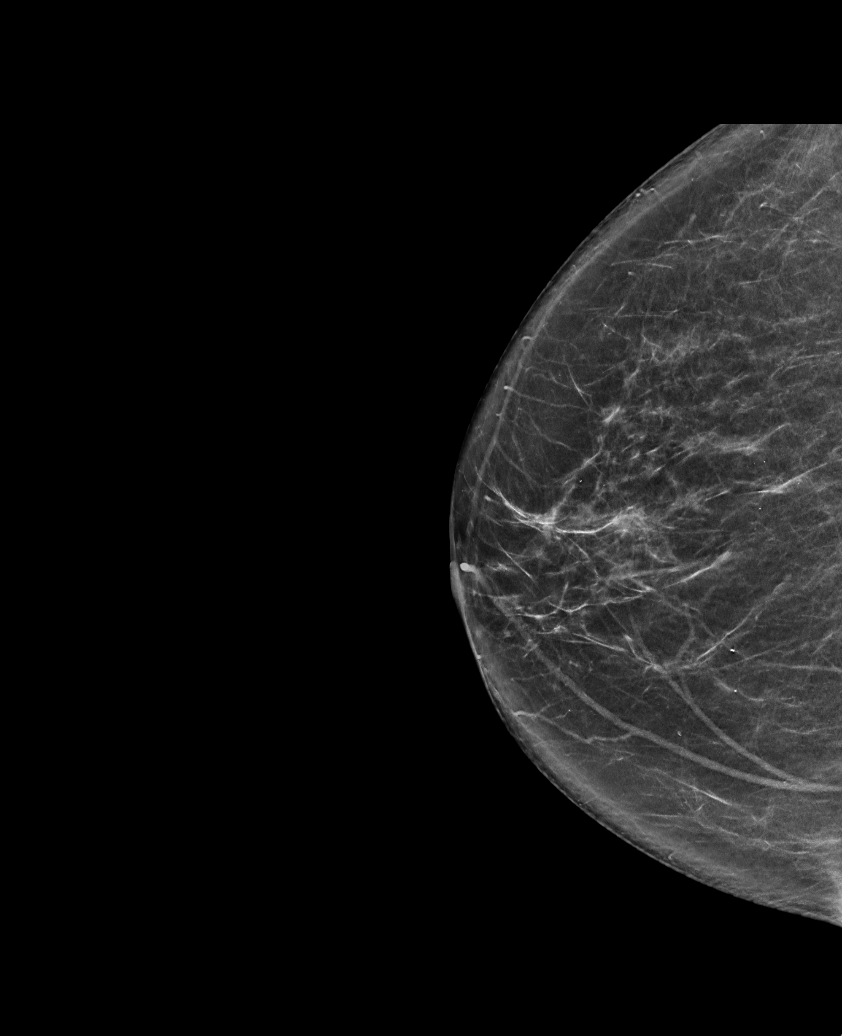

[L MLO synth-2D]
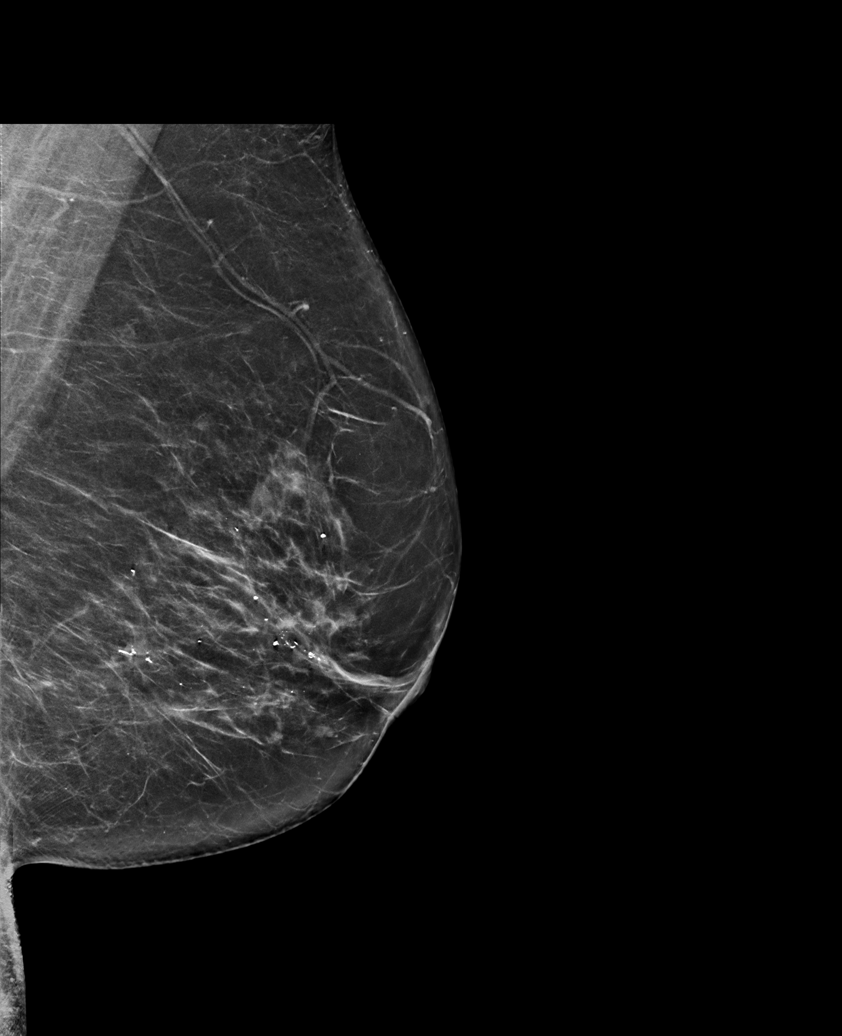

[L CC synth-2D]
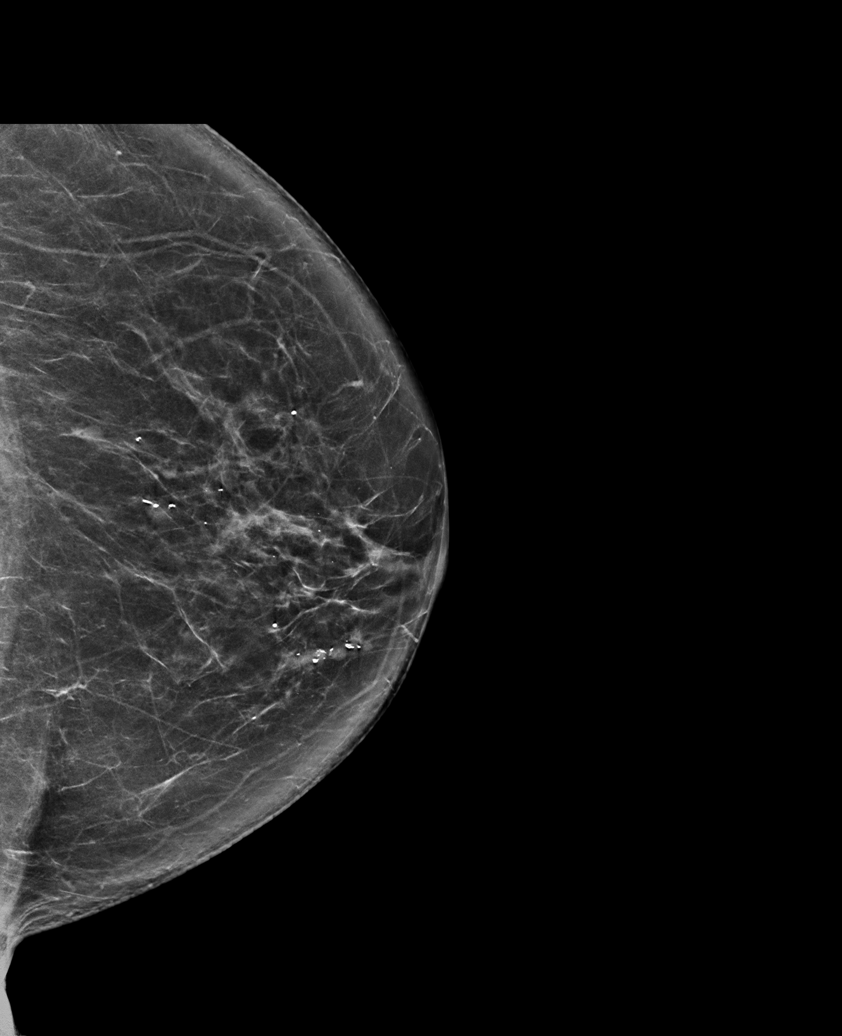

[L CC tomo · tomo slice 39/78.0]
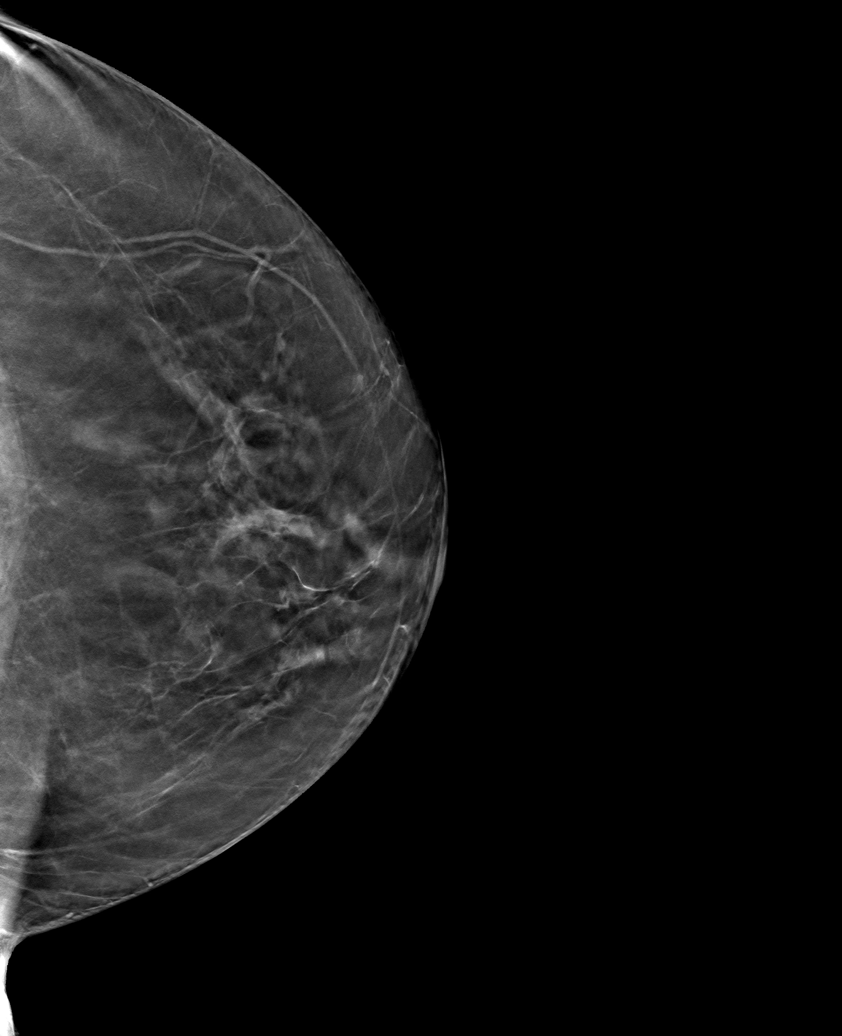

[6 of 30 positions shown; findings below may reference images not displayed]

ACR Breast Density Category b: There are scattered areas of
fibroglandular density.
FINDINGS: In the left breast, a possible asymmetry/calcifications warrants
further evaluation. In the right breast, no findings suspicious for
malignancy. Images were processed with CAD.
IMPRESSION: Further evaluation is suggested for possible asymmetry with
associated calcifications in the left breast.

RECOMMENDATION:
Diagnostic mammogram and possibly ultrasound of the left breast.
(Code:[id number]-[id number])

The patient will be contacted regarding the findings, and additional
imaging will be scheduled.

BI-RADS CATEGORY  0: Incomplete. Need additional imaging evaluation
and/or prior mammograms for comparison.

## 2019-10-25 ENCOUNTER — Encounter: Payer: Self-pay | Admitting: Urology

## 2019-10-25 ENCOUNTER — Ambulatory Visit (INDEPENDENT_AMBULATORY_CARE_PROVIDER_SITE_OTHER): Payer: Medicare Other | Admitting: Urology

## 2019-10-25 ENCOUNTER — Other Ambulatory Visit: Payer: Self-pay

## 2019-10-25 ENCOUNTER — Ambulatory Visit
Admission: RE | Admit: 2019-10-25 | Discharge: 2019-10-25 | Disposition: A | Discharge status: Home / Self Care | Payer: Medicare Other | Source: Ambulatory Visit | Attending: Urology | Admitting: Urology

## 2019-10-25 VITALS — BP 157/80 | HR 77 | Ht 70.0 in | Wt 230.0 lb

## 2019-10-25 DIAGNOSIS — R3121 Asymptomatic microscopic hematuria: Secondary | ICD-10-CM | POA: Diagnosis not present

## 2019-10-25 DIAGNOSIS — N200 Calculus of kidney: Secondary | ICD-10-CM | POA: Insufficient documentation

## 2019-10-25 NOTE — Progress Notes (Signed)
10/25/19 12:57 PM   Maureen Brown 10-25-1950 [id number]  CC: right hydronephrosis, left 1.3cm renal stone  HPI: I saw Maureen Brown in urology clinic today for evaluation of right hydronephrosis and a left 1.3 cm lower pole stone seen on recent renal ultrasound.  She had a slight bump in her renal function with a creatinine of 1.37, EGFR 40 that prompted a renal ultrasound with PCP and this was performed on 10/14/2019.  This showed severe right hydronephrosis and proximal ureter new from renal ultrasound in 2019, as well as a nonobstructing 12 mm stone in the lower pole of the left kidney.  She has an extensive history of stones previously with over 10 stones in the past, and is previously undergone ureteroscopy with Dr. Bernardo Heater in 2014 as well as with Dr. Jacqlyn Larsen at [facility].  She denies any flank pain, fevers, chills, gross hematuria, UTI symptoms, or malaise today.  She denies a history of UTIs.  She has been on potassium citrate since at least 2015 for recurrent stone disease, but continues to form stones.  Urinalysis today with greater than 30 WBCs, 3-10 RBCs, no bacteria, no yeast, nitrite negative.  Will send for culture.   PMH: Past Medical History:  Diagnosis Date  . Anemia   . Anxiety   . Arthritis   . Asthma 1989  . Cancer ([place]) 1989   thyroid  . Chest pain   . Cystitis, unspecified   . Depression   . GERD (gastroesophageal reflux disease)   . Grief reaction 2008  . Herpes labialis   . History of kidney stones   . Hypothyroidism   . Insomnia   . Kidney stone 2014, 2018  . Leg edema   . Leukopenia   . Pleural effusion   . Pneumonia   . 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 Stress incontinence   . Varicose veins of lower extremities with other complications   . Vitamin B 12 deficiency     Surgical History: Past Surgical History:  Procedure Laterality Date  . ABDOMINAL HYSTERECTOMY  1983  . ABLATION SAPHENOUS VEIN W/ RFA Left 09/2012   GSV  . APPENDECTOMY    .  BREAST BIOPSY Right 1973   neg  . BREAST BIOPSY Left 08/26/2017   Papilloma- X clip  . BREAST CYST REMOVAL    . BREAST EXCISIONAL BIOPSY Left 09/20/2017   papalloma  . BREAST LUMPECTOMY WITH NEEDLE LOCALIZATION Left 09/20/2017   Procedure: BREAST LUMPECTOMY WITH NEEDLE LOCALIZATION;  Surgeon: Herbert Pun, MD;  Location: [facility] ORS;  Service: General;  Laterality: Left;  . CHOLECYSTECTOMY  2002  . COLONOSCOPY  2009   Dr. Vira Agar  . COLONOSCOPY WITH PROPOFOL N/A 09/30/2015   Procedure: COLONOSCOPY WITH PROPOFOL;  Surgeon: Manya Silvas, MD;  Location: [facility] ENDOSCOPY;  Service: Endoscopy;  Laterality: N/A;  . KIDNEY STONE SURGERY  2014  . KIDNEY STONE SURGERY  06/27/13, 07-09-16   Dr Jacqlyn Larsen  . KNEE SURGERY Right 1989  . THROAT SURGERY  2012   vocal cord surgery  . THYROID SURGERY  1988  . TONSILLECTOMY    . TOTAL THYROIDECTOMY  2011  . UPPER GI ENDOSCOPY  2013    Family History: Family History  Problem Relation Age of Onset  . Breast cancer Maternal Grandmother   . Breast cancer Maternal Aunt   . Thyroid cancer Paternal Grandmother   . Heart disease Mother   . Heart disease Father     Social History:  reports that she  has never smoked. She has never used smokeless tobacco. She reports current alcohol use. She reports that she does not use drugs.  Physical Exam: BP (!) 157/80 (BP Location: Left Arm, Patient Position: Sitting, Cuff Size: Large)   Pulse 77   Ht '5\' 10"'  (1.778 m)   Wt 230 lb (104.3 kg)   BMI 33.00 kg/m    Constitutional:  Alert and oriented, No acute distress. Cardiovascular: No clubbing, cyanosis, or edema. Respiratory: Normal respiratory effort, no increased work of breathing. GI: Abdomen is soft, nontender, nondistended, no abdominal masses GU: No CVA tenderness  Laboratory Data: See HPI  Pertinent Imaging: I have personally reviewed the renal ultrasound showing severe right hydronephrosis, and a nonobstructive 1.3 cm left lower pole stone.  A  stat CT scan was ordered today which showed a non-obstructive 1 cm left lower pole stone, and scattered small right renal stones but no evidence of hydronephrosis.  Assessment & Plan:   In summary, she is a 69 year old female with persistent microscopic hematuria on multiple urine samples, CT today with no obstructive stones, but a 1 cm large left lower pole stone.  She has had recurrent stone disease in the past.  I recommended proceeding with cystoscopy, bilateral retrograde pyelograms for better evaluation of her persistent microscopic hematuria without an obstructing stone, as well as left ureteroscopy, laser lithotripsy, and stent placement for her large left renal stone.    We specifically discussed the risks ureteroscopy including bleeding, infection/sepsis, stent related symptoms including flank pain/urgency/frequency/incontinence/dysuria, ureteral injury, inability to access stone, or need for staged or additional procedures.  Schedule cystoscopy, bilateral retrograde pyelograms, left ureteroscopy, laser lithotripsy, stent [id number] urine metabolic work-up and follow-up   Nickolas Madrid, MD 10/25/2019  [facility] [address] [place], [place] [address] [phone]

## 2019-10-25 NOTE — H&P (View-Only) (Signed)
10/25/19 12:57 PM   Elsworth Soho Mandel 1950-06-08 [id number]  CC: right hydronephrosis, left 1.3cm renal stone  HPI: I saw Ms. Gartin in urology clinic today for evaluation of right hydronephrosis and a left 1.3 cm lower pole stone seen on recent renal ultrasound.  She had a slight bump in her renal function with a creatinine of 1.37, EGFR 40 that prompted a renal ultrasound with PCP and this was performed on 10/14/2019.  This showed severe right hydronephrosis and proximal ureter new from renal ultrasound in 2019, as well as a nonobstructing 12 mm stone in the lower pole of the left kidney.  She has an extensive history of stones previously with over 10 stones in the past, and is previously undergone ureteroscopy with Dr. Bernardo Heater in 2014 as well as with Dr. Jacqlyn Lars[facility] at [facility].  She denies any flank pain, fevers, chills, gross hematuria, UTI symptoms, or malaise today.  She denies a history of UTIs.  She has been on potassium citrate since at least 2015 for recurrent stone disease, but continues to form stones.  Urinalysis today with greater than 30 WBCs, 3-10 RBCs, no bacteria, no yeast, nitrite negative.  Will send for culture.   PMH: Past Medical History:  Diagnosis Date  . Anemia   . Anxiety   . Arthritis   . Asthma 1989  . Cancer ([place]) 1989   thyroid  . Chest pain   . Cystitis, unspecified   . Depression   . GERD (gastroesophageal reflux disease)   . Grief reaction 2008  . Herpes labialis   . History of kidney stones   . Hypothyroidism   . Insomnia   . Kidney stone 2014, 2018  . Leg edema   . Leukopenia   . Pleural effusion   . Pneumonia   . 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 Stress incontinence   . Varicose veins of lower extremities with other complications   . Vitamin B 12 deficiency     Surgical History: Past Surgical History:  Procedure Laterality Date  . ABDOMINAL HYSTERECTOMY  1983  . ABLATION SAPHENOUS VEIN W/ RFA Left 09/2012   GSV  . APPENDECTOMY    .  BREAST BIOPSY Right 1973   neg  . BREAST BIOPSY Left 08/26/2017   Papilloma- X clip  . BREAST CYST REMOVAL    . BREAST EXCISIONAL BIOPSY Left 09/20/2017   papalloma  . BREAST LUMPECTOMY WITH NEEDLE LOCALIZATION Left 09/20/2017   Procedure: BREAST LUMPECTOMY WITH NEEDLE LOCALIZATION;  Surgeon: Herbert Pun, MD;  Location: [facility] ORS;  Service: General;  Laterality: Left;  . CHOLECYSTECTOMY  2002  . COLONOSCOPY  2009   Dr. Vira Agar  . COLONOSCOPY WITH PROPOFOL N/A 09/30/2015   Procedure: COLONOSCOPY WITH PROPOFOL;  Surgeon: Manya Silvas, MD;  Location: [facility] ENDOSCOPY;  Service: Endoscopy;  Laterality: N/A;  . KIDNEY STONE SURGERY  2014  . KIDNEY STONE SURGERY  06/27/13, 07-09-16   Dr Jacqlyn Larsen  . KNEE SURGERY Right 1989  . THROAT SURGERY  2012   vocal cord surgery  . THYROID SURGERY  1988  . TONSILLECTOMY    . TOTAL THYROIDECTOMY  2011  . UPPER GI ENDOSCOPY  2013    Family History: Family History  Problem Relation Age of Onset  . Breast cancer Maternal Grandmother   . Breast cancer Maternal Aunt   . Thyroid cancer Paternal Grandmother   . Heart disease Mother   . Heart disease Father     Social History:  reports that she  has never smoked. She has never used smokeless tobacco. She reports current alcohol use. She reports that she does not use drugs.  Physical Exam: BP (!) 157/80 (BP Location: Left Arm, Patient Position: Sitting, Cuff Size: Large)   Pulse 77   Ht '5\' 10"'  (1.778 m)   Wt 230 lb (104.3 kg)   BMI 33.00 kg/m    Constitutional:  Alert and oriented, No acute distress. Cardiovascular: No clubbing, cyanosis, or edema. Respiratory: Normal respiratory effort, no increased work of breathing. GI: Abdomen is soft, nontender, nondistended, no abdominal masses GU: No CVA tenderness  Laboratory Data: See HPI  Pertinent Imaging: I have personally reviewed the renal ultrasound showing severe right hydronephrosis, and a nonobstructive 1.3 cm left lower pole stone.  A  stat CT scan was ordered today which showed a non-obstructive 1 cm left lower pole stone, and scattered small right renal stones but no evidence of hydronephrosis.  Assessment & Plan:   In summary, she is a 69 year old female with persistent microscopic hematuria on multiple urine samples, CT today with no obstructive stones, but a 1 cm large left lower pole stone.  She has had recurrent stone disease in the past.  I recommended proceeding with cystoscopy, bilateral retrograde pyelograms for better evaluation of her persistent microscopic hematuria without an obstructing stone, as well as left ureteroscopy, laser lithotripsy, and stent placement for her large left renal stone.    We specifically discussed the risks ureteroscopy including bleeding, infection/sepsis, stent related symptoms including flank pain/urgency/frequency/incontinence/dysuria, ureteral injury, inability to access stone, or need for staged or additional procedures.  Schedule cystoscopy, bilateral retrograde pyelograms, left ureteroscopy, laser lithotripsy, stent [id number] urine metabolic work-up and follow-up   Nickolas Madrid, MD 10/25/2019  [facility] [address], [place], [place] [address] [phone]

## 2019-10-26 LAB — URINALYSIS, COMPLETE
Bilirubin, UA: NEGATIVE
Glucose, UA: NEGATIVE
Ketones, UA: NEGATIVE
Nitrite, UA: NEGATIVE
Protein,UA: NEGATIVE
Specific Gravity, UA: 1.02 (ref 1.005–1.030)
Urobilinogen, Ur: 0.2 mg/dL (ref 0.2–1.0)
pH, UA: 6.5 (ref 5.0–7.5)

## 2019-10-26 LAB — MICROSCOPIC EXAMINATION
Bacteria, UA: NONE SEEN
WBC, UA: >30 /hpf — AB (ref 0–5)

## 2019-10-27 ENCOUNTER — Other Ambulatory Visit: Payer: Self-pay | Admitting: Urology

## 2019-10-27 DIAGNOSIS — N200 Calculus of kidney: Secondary | ICD-10-CM

## 2019-10-29 LAB — CULTURE, URINE COMPREHENSIVE

## 2019-11-03 ENCOUNTER — Other Ambulatory Visit: Payer: Self-pay

## 2019-11-03 ENCOUNTER — Encounter
Admission: RE | Admit: 2019-11-03 | Discharge: 2019-11-03 | Disposition: A | Discharge status: Home / Self Care | Payer: Medicare Other | Source: Ambulatory Visit | Attending: Urology | Admitting: Urology

## 2019-11-03 DIAGNOSIS — Z01810 Encounter for preprocedural cardiovascular examination: Secondary | ICD-10-CM | POA: Insufficient documentation

## 2019-11-03 HISTORY — DX: Nausea with vomiting, unspecified: R11.2

## 2019-11-03 HISTORY — DX: Dyspnea, unspecified: R06.00

## 2019-11-03 HISTORY — DX: Other specified postprocedural states: Z98.890

## 2019-11-03 HISTORY — DX: Thrombocytopenia, unspecified: D69.6

## 2019-11-03 NOTE — Patient Instructions (Signed)
Your procedure is scheduled on: 11-10-19 FRIDAY Report to Same Day Surgery 2nd floor medical mall [facility] Entrance-take elevator on left to 2nd floor.  Check in with surgery information desk.) To find out your arrival time please call [phone] between 1PM - 3PM on 11-09-19 THURSDAY  Remember: Instructions that are not followed completely may result in serious medical risk, up to and including death, or upon the discretion of your surgeon and anesthesiologist your surgery may need to be rescheduled.    _x___ 1. Do not eat food after midnight the night before your procedure. NO GUM OR CANDY AFTER MIDNIGHT. You may drink clear liquids up to 2 hours before you are scheduled to arrive at the hospital for your procedure.  Do not drink clear liquids within 2 hours of your scheduled arrival to the hospital.  Clear liquids include  --Water or Apple juice without pulp  --Gatorade  --Black Coffee or Clear Tea (No milk, no creamers, do not add anything to the coffee or Tea-OK TO ADD SUGAR)s   __x__ 2. No Alcohol for 24 hours before or after surgery.   __x__3. No Smoking or e-cigarettes for 24 prior to surgery.  Do not use any chewable tobacco products for at least 6 hour prior to surgery   ____  4. Bring all medications with you on the day of surgery if instructed.    __x__ 5. Notify your doctor if there is any change in your medical condition     (cold, fever, infections).    x___6. On the morning of surgery brush your teeth with toothpaste and water.  You may rinse your mouth with mouth wash if you wish.  Do not swallow any toothpaste or mouthwash.   Do not wear jewelry, make-up, hairpins, clips or nail polish.  Do not wear lotions, powders, or perfumes. You may wear deodorant.  Do not shave 48 hours prior to surgery. Men may shave face and neck.  Do not bring valuables to the hospital.    [facility] is not responsible for any belongings or valuables.               Contacts,  dentures or bridgework may not be worn into surgery.  Leave your suitcase in the car. After surgery it may be brought to your room.  For patients admitted to the hospital, discharge time is determined by your treatment team.  _  Patients discharged the day of surgery will not be allowed to drive home.  You will need someone to drive you home and stay with you the night of your procedure.    Please read over the following fact sheets that you were given:   [facility] Preparing for Surgery and or MRSA Information   _x___ TAKE THE FOLLOWING MEDICATION THE MORNING OF SURGERY WITH A SMALL SIP OF WATER. These include:  1. SYNTHROID (LEVOTHYROXINE)  2. FLOMAX (TAMSULOSIN)  3.  4.  5.  6.  ____Fleets enema or Magnesium Citrate as directed.   ____ Use CHG Soap or sage wipes as directed on instruction sheet   _X___ Use inhalers on the day of surgery and bring to hospital day of surgery-USE YOUR ALBUTEROL INHALER THE MORNING OF SURGERY AND [place]  ____ Stop Metformin and Janumet 2 days prior to surgery.    ____ Take 1/2 of usual insulin dose the night before surgery and none on the morning surgery.   ____ Follow recommendations from Cardiologist, Pulmonologist or PCP regarding stopping  Aspirin, Coumadin, Plavix ,Eliquis, Effient, or Pradaxa, and Pletal.  X____Stop Anti-inflammatories such as Advil, Aleve, Ibuprofen, Motrin, Naproxen, Naprosyn, Goodies powders or aspirin products NOW-OK to take Tylenol .   ____ Stop supplements until after surgery.    ____ Bring C-Pap to the hospital.

## 2019-11-06 ENCOUNTER — Telehealth: Payer: Self-pay | Admitting: Urology

## 2019-11-06 NOTE — Telephone Encounter (Signed)
Pt. Left message stating she is in a lot of pain and wants to know if Dr. Diamantina Providence can prescribe something to get her through this week. Surgery is on Friday 11/10/19. Please advise.

## 2019-11-07 ENCOUNTER — Other Ambulatory Visit: Payer: Self-pay | Admitting: Urology

## 2019-11-07 MED ORDER — HYDROCODONE-ACETAMINOPHEN 5-325 MG PO TABS: 1.0000 | ORAL_TABLET | Freq: Four times a day (QID) | ORAL | 0 refills | Status: DC | PRN

## 2019-11-07 NOTE — Telephone Encounter (Signed)
Where is she having pain? She has a non-obstructing left lower pole stone we are treating this Friday, so that should not be causing her pain  Nickolas Madrid, MD 11/07/2019

## 2019-11-07 NOTE — Telephone Encounter (Signed)
Incoming call from pt who states that she is having back pain. Pt states that pain today is 7/10, was 10/10 yesterday. Denies hematuria, nausea, or vomiting. Pain is located on the left flank, and lower abdomen. Pt states she is taking Tylenol w/ no relief. Spoke with Dr. Diamantina Providence, RX sent in for [facility]. Pt made aware.

## 2019-11-08 ENCOUNTER — Other Ambulatory Visit
Admission: RE | Admit: 2019-11-08 | Discharge: 2019-11-08 | Disposition: A | Discharge status: Home / Self Care | Payer: Medicare Other | Source: Ambulatory Visit | Attending: Urology | Admitting: Urology

## 2019-11-08 ENCOUNTER — Other Ambulatory Visit: Payer: Self-pay

## 2019-11-08 DIAGNOSIS — Z20822 Contact with and (suspected) exposure to covid-19: Secondary | ICD-10-CM | POA: Diagnosis not present

## 2019-11-08 DIAGNOSIS — Z01812 Encounter for preprocedural laboratory examination: Secondary | ICD-10-CM | POA: Diagnosis present

## 2019-11-09 LAB — SARS CORONAVIRUS 2 (TAT 6-24 HRS): SARS Coronavirus 2: NEGATIVE

## 2019-11-10 ENCOUNTER — Ambulatory Visit
Admission: RE | Admit: 2019-11-10 | Discharge: 2019-11-10 | Disposition: A | Discharge status: Home / Self Care | Payer: Medicare Other | Attending: Urology | Admitting: Urology

## 2019-11-10 ENCOUNTER — Encounter: Payer: Self-pay | Admitting: Urology

## 2019-11-10 ENCOUNTER — Other Ambulatory Visit: Payer: Self-pay

## 2019-11-10 ENCOUNTER — Encounter
Admission: RE | Disposition: A | Discharge status: Home / Self Care | Payer: Self-pay | Source: Home / Self Care | Attending: Urology

## 2019-11-10 ENCOUNTER — Ambulatory Visit: Payer: Medicare Other | Admitting: Certified Registered"

## 2019-11-10 ENCOUNTER — Ambulatory Visit: Payer: Medicare Other

## 2019-11-10 DIAGNOSIS — N200 Calculus of kidney: Secondary | ICD-10-CM | POA: Diagnosis not present

## 2019-11-10 DIAGNOSIS — M1990 Unspecified osteoarthritis, unspecified site: Secondary | ICD-10-CM | POA: Diagnosis not present

## 2019-11-10 DIAGNOSIS — J45909 Unspecified asthma, uncomplicated: Secondary | ICD-10-CM | POA: Diagnosis not present

## 2019-11-10 DIAGNOSIS — I739 Peripheral vascular disease, unspecified: Secondary | ICD-10-CM | POA: Diagnosis not present

## 2019-11-10 DIAGNOSIS — E890 Postprocedural hypothyroidism: Secondary | ICD-10-CM | POA: Insufficient documentation

## 2019-11-10 HISTORY — PX: CYSTOSCOPY/URETEROSCOPY/HOLMIUM LASER/STENT PLACEMENT: SHX6546

## 2019-11-10 HISTORY — PX: CYSTOSCOPY W/ RETROGRADES: SHX1426

## 2019-11-10 SURGERY — CYSTOSCOPY/URETEROSCOPY/HOLMIUM LASER/STENT PLACEMENT
Anesthesia: General | Laterality: Left

## 2019-11-10 MED ORDER — TRAMADOL HCL 50 MG PO TABS
50.0000 mg | ORAL_TABLET | Freq: Four times a day (QID) | ORAL | 0 refills | Status: AC | PRN
Start: 2019-11-10 — End: 2019-11-13

## 2019-11-10 MED ORDER — PHENYLEPHRINE HCL (PRESSORS) 10 MG/ML IV SOLN
INTRAVENOUS | Status: AC
  Filled 2019-11-10: qty 1

## 2019-11-10 MED ORDER — FENTANYL CITRATE (PF) 100 MCG/2ML IJ SOLN
INTRAMUSCULAR | Status: AC
  Filled 2019-11-10: qty 2

## 2019-11-10 MED ORDER — DEXAMETHASONE SODIUM PHOSPHATE 4 MG/ML IJ SOLN
INTRAMUSCULAR | Status: DC | PRN
  Administered 2019-11-10: 4 mg via INTRAVENOUS

## 2019-11-10 MED ORDER — LACTATED RINGERS IV SOLN: Freq: Once | INTRAVENOUS | Status: AC

## 2019-11-10 MED ORDER — PROPOFOL 10 MG/ML IV BOLUS
INTRAVENOUS | Status: DC | PRN
  Administered 2019-11-10: 50 mg via INTRAVENOUS
  Administered 2019-11-10: 150 mg via INTRAVENOUS

## 2019-11-10 MED ORDER — ONDANSETRON HCL 4 MG/2ML IJ SOLN: 4.0000 mg | Freq: Once | INTRAMUSCULAR | Status: DC | PRN

## 2019-11-10 MED ORDER — NITROFURANTOIN MACROCRYSTAL 100 MG PO CAPS: 100.0000 mg | ORAL_CAPSULE | Freq: Every day | ORAL | 0 refills | Status: AC

## 2019-11-10 MED ORDER — CHLORHEXIDINE GLUCONATE 0.12 % MT SOLN
OROMUCOSAL | Status: AC
  Filled 2019-11-10: qty 15

## 2019-11-10 MED ORDER — FENTANYL CITRATE (PF) 100 MCG/2ML IJ SOLN
INTRAMUSCULAR | Status: DC | PRN
Start: 2019-11-10 — End: 2019-11-10
  Administered 2019-11-10 (×2): 50 ug via INTRAVENOUS

## 2019-11-10 MED ORDER — CEFAZOLIN SODIUM-DEXTROSE 2-4 GM/100ML-% IV SOLN
2.0000 g | INTRAVENOUS | Status: AC
  Administered 2019-11-10: 2 g via INTRAVENOUS

## 2019-11-10 MED ORDER — LIDOCAINE HCL (CARDIAC) PF 100 MG/5ML IV SOSY
PREFILLED_SYRINGE | INTRAVENOUS | Status: DC | PRN
  Administered 2019-11-10: 100 mg via INTRAVENOUS

## 2019-11-10 MED ORDER — BELLADONNA ALKALOIDS-OPIUM 16.2-60 MG RE SUPP
RECTAL | Status: AC
  Filled 2019-11-10: qty 1

## 2019-11-10 MED ORDER — SUGAMMADEX SODIUM 200 MG/2ML IV SOLN
INTRAVENOUS | Status: DC | PRN
  Administered 2019-11-10: 200 mg via INTRAVENOUS

## 2019-11-10 MED ORDER — ONDANSETRON HCL 4 MG/2ML IJ SOLN
INTRAMUSCULAR | Status: AC
  Filled 2019-11-10: qty 2

## 2019-11-10 MED ORDER — IOHEXOL 180 MG/ML  SOLN
INTRAMUSCULAR | Status: DC | PRN
  Administered 2019-11-10: 10 mL
  Administered 2019-11-10: 20 mL

## 2019-11-10 MED ORDER — FENTANYL CITRATE (PF) 100 MCG/2ML IJ SOLN: 25.0000 ug | INTRAMUSCULAR | Status: DC | PRN

## 2019-11-10 MED ORDER — PROPOFOL 10 MG/ML IV BOLUS
INTRAVENOUS | Status: AC
  Filled 2019-11-10: qty 20

## 2019-11-10 MED ORDER — ONDANSETRON HCL 4 MG/2ML IJ SOLN
INTRAMUSCULAR | Status: DC | PRN
  Administered 2019-11-10: 4 mg via INTRAVENOUS

## 2019-11-10 MED ORDER — OXYBUTYNIN CHLORIDE ER 10 MG PO TB24: 10.0000 mg | ORAL_TABLET | Freq: Every day | ORAL | 0 refills | Status: AC | PRN

## 2019-11-10 MED ORDER — CHLORHEXIDINE GLUCONATE 0.12 % MT SOLN: 15.0000 mL | Freq: Once | OROMUCOSAL | Status: AC

## 2019-11-10 MED ORDER — BELLADONNA ALKALOIDS-OPIUM 16.2-60 MG RE SUPP
RECTAL | Status: DC | PRN
Start: 2019-11-10 — End: 2019-11-10
  Administered 2019-11-10: 1 via RECTAL

## 2019-11-10 MED ORDER — ORAL CARE MOUTH RINSE
15.0000 mL | Freq: Once | OROMUCOSAL | Status: AC
  Administered 2019-11-10: 15 mL via OROMUCOSAL

## 2019-11-10 MED ORDER — ROCURONIUM BROMIDE 10 MG/ML (PF) SYRINGE
PREFILLED_SYRINGE | INTRAVENOUS | Status: AC
  Filled 2019-11-10: qty 10

## 2019-11-10 MED ORDER — CEFAZOLIN SODIUM-DEXTROSE 2-4 GM/100ML-% IV SOLN
INTRAVENOUS | Status: AC
  Filled 2019-11-10: qty 100

## 2019-11-10 MED ORDER — LACTATED RINGERS IV SOLN: INTRAVENOUS | Status: DC | PRN

## 2019-11-10 MED ORDER — ROCURONIUM BROMIDE 100 MG/10ML IV SOLN
INTRAVENOUS | Status: DC | PRN
  Administered 2019-11-10: 40 mg via INTRAVENOUS

## 2019-11-10 SURGICAL SUPPLY — 31 items
BAG DRAIN CYSTO-URO LG1000N (MISCELLANEOUS) ×4 IMPLANT
BRUSH SCRUB EZ 1% IODOPHOR (MISCELLANEOUS) ×4 IMPLANT
CATH URETL 5X70 OPEN END (CATHETERS) ×4 IMPLANT
CNTNR SPEC 2.5X3XGRAD LEK (MISCELLANEOUS)
CONRAY 43 FOR UROLOGY 50M (MISCELLANEOUS) ×4 IMPLANT
CONT SPEC 4OZ STER OR WHT (MISCELLANEOUS)
CONT SPEC 4OZ STRL OR WHT (MISCELLANEOUS)
CONTAINER SPEC 2.5X3XGRAD LEK (MISCELLANEOUS) IMPLANT
DRAPE UTILITY 15X26 TOWEL STRL (DRAPES) ×4 IMPLANT
FIBER LASER TRAC TIP (UROLOGICAL SUPPLIES) ×4 IMPLANT
GLOVE BIOGEL PI IND STRL 7.5 (GLOVE) ×2 IMPLANT
GLOVE BIOGEL PI INDICATOR 7.5 (GLOVE) ×2
GOWN STRL REUS W/ TWL LRG LVL3 (GOWN DISPOSABLE) ×2 IMPLANT
GOWN STRL REUS W/ TWL XL LVL3 (GOWN DISPOSABLE) ×2 IMPLANT
GOWN STRL REUS W/TWL LRG LVL3 (GOWN DISPOSABLE) ×4
GOWN STRL REUS W/TWL XL LVL3 (GOWN DISPOSABLE) ×4
GUIDEWIRE STR DUAL SENSOR (WIRE) ×4 IMPLANT
INFUSOR MANOMETER BAG 3000ML (MISCELLANEOUS) ×4 IMPLANT
INTRODUCER DILATOR DOUBLE (INTRODUCER) IMPLANT
KIT TURNOVER CYSTO (KITS) ×4 IMPLANT
PACK CYSTO AR (MISCELLANEOUS) ×4 IMPLANT
SET CYSTO W/LG BORE CLAMP LF (SET/KITS/TRAYS/PACK) ×4 IMPLANT
SOL .9 NS 3000ML IRR  AL (IV SOLUTION) ×2
SOL .9 NS 3000ML IRR AL (IV SOLUTION) ×2
SOL .9 NS 3000ML IRR UROMATIC (IV SOLUTION) ×2 IMPLANT
STENT URET 6FRX24 CONTOUR (STENTS) IMPLANT
STENT URET 6FRX26 CONTOUR (STENTS) ×4 IMPLANT
SURGILUBE 2OZ TUBE FLIPTOP (MISCELLANEOUS) ×4 IMPLANT
SYR 10ML LL (SYRINGE) ×4 IMPLANT
VALVE UROSEAL ADJ ENDO (VALVE) IMPLANT
WATER STERILE IRR 1000ML POUR (IV SOLUTION) ×4 IMPLANT

## 2019-11-10 NOTE — Anesthesia Procedure Notes (Signed)
Procedure Name: Intubation Date/Time: 11/10/2019 3:12 PM Performed by: Chanetta Marshall, CRNA Pre-anesthesia Checklist: Patient identified, Emergency Drugs available, Suction available and Patient being monitored Patient Re-evaluated:Patient Re-evaluated prior to induction Oxygen Delivery Method: Circle system utilized Preoxygenation: Pre-oxygenation with 100% oxygen Induction Type: IV induction Ventilation: Mask ventilation without difficulty Laryngoscope Size: McGraph and 3 Grade View: Grade I Tube type: Oral Tube size: 7.0 mm Number of attempts: 1 Airway Equipment and Method: Stylet and Video-laryngoscopy Placement Confirmation: ETT inserted through vocal cords under direct vision,  positive ETCO2,  breath sounds checked- equal and bilateral and CO2 detector Secured at: 20 cm Tube secured with: Tape Dental Injury: Teeth and Oropharynx as per pre-operative assessment

## 2019-11-10 NOTE — Anesthesia Postprocedure Evaluation (Signed)
Anesthesia Post Note  Patient: Maureen Brown  Procedure(s) Performed: CYSTOSCOPY/URETEROSCOPY/HOLMIUM LASER/STENT PLACEMENT (Left ) CYSTOSCOPY WITH RETROGRADE PYELOGRAM (Bilateral )  Patient location during evaluation: PACU Anesthesia Type: General Level of consciousness: awake and alert Pain management: pain level controlled Vital Signs Assessment: post-procedure vital signs reviewed and stable Respiratory status: spontaneous breathing, nonlabored ventilation, respiratory function stable and patient connected to nasal cannula oxygen Cardiovascular status: blood pressure returned to baseline and stable Postop Assessment: no apparent nausea or vomiting Anesthetic complications: no   No complications documented.   Last Vitals:  Vitals:   11/10/19 1655 11/10/19 1703  BP: 125/76 115/70  Pulse: 73 71  Resp: 11 18  Temp:  36.8 C  SpO2: 99% 100%    Last Pain:  Vitals:   11/10/19 1703  TempSrc: Temporal  PainSc: 0-No pain                 Martha Clan

## 2019-11-10 NOTE — Transfer of Care (Signed)
Immediate Anesthesia Transfer of Care Note  Patient: Maureen Brown  Procedure(s) Performed: CYSTOSCOPY/URETEROSCOPY/HOLMIUM LASER/STENT PLACEMENT (Left ) CYSTOSCOPY WITH RETROGRADE PYELOGRAM (Bilateral )  Patient Location: PACU  Anesthesia Type:General  Level of Consciousness: awake, alert  and oriented  Airway & Oxygen Therapy: Patient Spontanous Breathing  Post-op Assessment: Report given to RN and Post -op Vital signs reviewed and stable  Post vital signs: Reviewed and stable  Last Vitals:  Vitals Value Taken Time  BP    Temp    Pulse    Resp    SpO2      Last Pain:  Vitals:   11/10/19 1255  TempSrc: Tympanic  PainSc: 5          Complications: No complications documented.

## 2019-11-10 NOTE — Anesthesia Preprocedure Evaluation (Signed)
Anesthesia Evaluation  Patient identified by MRN, date of birth, ID band Patient awake    Reviewed: Allergy & Precautions, NPO status , Patient's Chart, lab work & pertinent test results, reviewed documented beta blocker date and time   Airway Mallampati: III  TM Distance: >3 FB     Dental  (+) Chipped   Pulmonary shortness of breath and with exertion, asthma , pneumonia, resolved, Patient abstained from smoking.,           Cardiovascular + Peripheral Vascular Disease       Neuro/Psych PSYCHIATRIC DISORDERS Anxiety Depression negative neurological ROS     GI/Hepatic Neg liver ROS, GERD  Controlled,  Endo/Other  Hypothyroidism   Renal/GU Renal disease Bladder dysfunction      Musculoskeletal  (+) Arthritis ,   Abdominal   Peds negative pediatric ROS (+)  Hematology  (+) anemia ,   Anesthesia Other Findings EKG ok.  Reproductive/Obstetrics                             Anesthesia Physical  Anesthesia Plan  ASA: III  Anesthesia Plan: General   Post-op Pain Management:    Induction: Intravenous  PONV Risk Score and Plan:   Airway Management Planned: Oral ETT  Additional Equipment:   Intra-op Plan:   Post-operative Plan: Extubation in OR  Informed Consent: I have reviewed the patients History and Physical, chart, labs and discussed the procedure including the risks, benefits and alternatives for the proposed anesthesia with the patient or authorized representative who has indicated his/her understanding and acceptance.       Plan Discussed with: CRNA and Surgeon  Anesthesia Plan Comments:         Anesthesia Quick Evaluation

## 2019-11-10 NOTE — Discharge Instructions (Signed)

## 2019-11-10 NOTE — Op Note (Signed)
Date of procedure: 11/10/19  Preoperative diagnosis:  1. Left renal stones 2. Microscopic hematuria  Postoperative diagnosis:  1. Same  Procedure: 1. Bilateral retrograde pyelograms with intraoperative interpretation 2. Left ureteroscopy, laser lithotripsy, stent placement  Surgeon: Nickolas Madrid, MD  Anesthesia: General  Complications: None  Intraoperative findings:  1.  Normal cystoscopy, no filling defects bilaterally on retrogrades 2.  Uncomplicated dusting of numerous lower pole and renal stones on the left side 3.  Uncomplicated stent placement  EBL: Minimal  Specimens: None  Drains: Left 6 French by 26 cm ureteral stent  Indication: Maureen Brown is a 69 y.o. patient with history of recurrent stone disease and a 1.3 cm stone on recent CT in the left side who opted for intervention.  After reviewing the management options for treatment, they elected to proceed with the above surgical procedure(s). We have discussed the potential benefits and risks of the procedure, side effects of the proposed treatment, the likelihood of the patient achieving the goals of the procedure, and any potential problems that might occur during the procedure or recuperation. Informed consent has been obtained.  Description of procedure:  The patient was taken to the operating room and general anesthesia was induced. SCDs were placed for DVT prophylaxis. The patient was placed in the dorsal lithotomy position, prepped and draped in the usual sterile fashion, and preoperative antibiotics(Ancef) were administered. A preoperative time-out was performed.   A 21 French rigid cystoscope was used to intubate the urethra and thorough cystoscopy was performed.  The bladder was grossly normal aside from some mild cystitis cystica.  Retrograde pyelogram was performed on the right side and showed no hydronephrosis or filling defect.  A retrograde pyelogram was performed on the left side and also showed no  hydronephrosis or filling defects.  A sensor wire was passed up into the kidney under fluoroscopic vision, and a 12/14 [place] access sheath was advanced gently over the wire and passed easily.  A dual-lumen ureteroscope was then advanced through the sheath up into the kidney and thorough pyeloscopy revealed numerous 4 to 5 mm stones in the lower pole.  These were all fragmented to <1 mm pieces using a 200 m laser fiber on settings of 1.0 J and 10 Hz.  Thorough pyeloscopy revealed no residual fragments at the conclusion of the case.  Contrast was injected and showed no filling defects or extravasation.  A wire was replaced through the scope and the scope and sheath removed.  A 6 French by 26 cm ureteral stent was passed under fluoroscopic vision with an excellent curl in the renal pelvis, as well as in the bladder.  The bladder was drained and a belladonna suppository was placed.  Disposition: Stable to PACU  Plan: Stent removal in 1 week Nitrofurantoin prophylaxis with stent in 24-hour urine for metabolic work-up and follow-up  Nickolas Madrid, MD

## 2019-11-10 NOTE — Interval H&P Note (Signed)
UROLOGY H&P UPDATE  Agree with prior H&P dated 10/25/19- 1.3 left renal stone.  Cardiac: RRR Lungs: CTA bilaterally  Laterality: bilateral retrogrades, left URS Procedure: Bilateral retrograde pyelograms, left URS/LL/stent  Urine: culture 8/11 <25k mixed flora  We specifically discussed the risks ureteroscopy including bleeding, infection/sepsis, stent related symptoms including flank pain/urgency/frequency/incontinence/dysuria, ureteral injury, inability to access stone, or need for staged or additional procedures.   Billey Co, MD 11/10/2019

## 2019-11-11 ENCOUNTER — Encounter: Payer: Self-pay | Admitting: Urology

## 2019-11-15 ENCOUNTER — Other Ambulatory Visit: Payer: Self-pay

## 2019-11-15 ENCOUNTER — Ambulatory Visit (INDEPENDENT_AMBULATORY_CARE_PROVIDER_SITE_OTHER): Payer: Medicare Other | Admitting: Urology

## 2019-11-15 ENCOUNTER — Encounter: Payer: Self-pay | Admitting: Urology

## 2019-11-15 VITALS — BP 144/79 | HR 76 | Ht 70.0 in | Wt 216.0 lb

## 2019-11-15 DIAGNOSIS — N200 Calculus of kidney: Secondary | ICD-10-CM

## 2019-11-15 DIAGNOSIS — Z466 Encounter for fitting and adjustment of urinary device: Secondary | ICD-10-CM | POA: Diagnosis not present

## 2019-11-15 MED ORDER — LIDOCAINE HCL URETHRAL/MUCOSAL 2 % EX GEL
1.0000 "application " | Freq: Once | CUTANEOUS | Status: AC
  Administered 2019-11-15: 1 via URETHRAL

## 2019-11-15 NOTE — Addendum Note (Signed)
Addended by: Gordy Clement C on: 11/15/2019 10:00 AM   Modules accepted: Orders

## 2019-11-15 NOTE — Progress Notes (Signed)
Cystoscopy Procedure Note:  Indication: Stent removal s/p left ureteroscopy/laser for 1.3 cm collection of lower pole stones  After informed consent and discussion of the procedure and its risks, Maureen Brown was positioned and prepped in the standard fashion. Cystoscopy was performed with a flexible cystoscope. The stent was grasped with flexible graspers and removed in its entirety. The patient tolerated the procedure well.  Findings: Uncomplicated stent removal  Assessment and Plan: Follow up in 6 weeks with renal ultrasound to evaluate for silent hydronephrosis, and 24-hour urine for stone prevention  Billey Co, MD 11/15/2019

## 2019-11-15 NOTE — Patient Instructions (Addendum)
[facility] Instructions [facility]  You will receive a box/kit in the mail that will have a urine jug and instructions in the kit.  When the box arrives you will need to call our office [phone] to schedule a LAB appointment.  You will need to do a 24hour urine and this should be done during the days that our office will be open.  For example any day from Sunday through Thursday.  If you take Vitamin C 159m or greater please stop this 5 days prior to collection.  How to collect the urine sample: On the day you start the urine sample this 1st morning urine should NOT be collected.  For the rest of the day including all night urines should be collected.  On the next morning the 1st urine should be collected and then you will be finished with the urine collections.  You will need to bring the box with you on your LAB appointment day after urine has been collected and all instructions are complete in the box.  Your blood will be drawn and the box will be collected by our Lab employee to be sent off for analysis.  When urine and blood is complete you will need to schedule a follow up appointment for lab results.  Ureteral Stent Implantation, Care After This sheet gives you information about how to care for yourself after your procedure. Your health care provider may also give you more specific instructions. If you have problems or questions, contact your health care provider. What can I expect after the procedure? After the procedure, it is common to have:  Nausea.  Mild pain when you urinate. You may feel this pain in your lower back or lower abdomen. The pain should stop within a few minutes after you urinate. This may last for up to 1 week.  A small amount of blood in your urine for several days. Follow these instructions at home: Medicines  Take over-the-counter and prescription medicines only as told by your health care provider.  If you were prescribed an  antibiotic medicine, take it as told by your health care provider. Do not stop taking the antibiotic even if you start to feel better.  Do not drive for 24 hours if you were given a sedative during your procedure.  Ask your health care provider if the medicine prescribed to you requires you to avoid driving or using heavy machinery. Activity  Rest as told by your health care provider.  Avoid sitting for a long time without moving. Get up to take short walks every 1-2 hours. This is important to improve blood flow and breathing. Ask for help if you feel weak or unsteady.  Return to your normal activities as told by your health care provider. Ask your health care provider what activities are safe for you. General instructions   Watch for any blood in your urine. Call your health care provider if the amount of blood in your urine increases.  If you have a catheter: ? Follow instructions from your health care provider about taking care of your catheter and collection bag. ? Do not take baths, swim, or use a hot tub until your health care provider approves. Ask your health care provider if you may take showers. You may only be allowed to take sponge baths.  Drink enough fluid to keep your urine pale yellow.  Do not use any products that contain nicotine or tobacco, such as cigarettes, e-cigarettes, and chewing tobacco. These can  delay healing after surgery. If you need help quitting, ask your health care provider.  Keep all follow-up visits as told by your health care provider. This is important. Contact a health care provider if:  You have pain that gets worse or does not get better with medicine, especially pain when you urinate.  You have difficulty urinating.  You feel nauseous or you vomit repeatedly during a period of more than 2 days after the procedure. Get help right away if:  Your urine is dark red or has blood clots in it.  You are leaking urine (have incontinence).  The  end of the stent comes out of your urethra.  You cannot urinate.  You have sudden, sharp, or severe pain in your abdomen or lower back.  You have a fever.  You have swelling or pain in your legs.  You have difficulty breathing. Summary  After the procedure, it is common to have mild pain when you urinate that goes away within a few minutes after you urinate. This may last for up to 1 week.  Watch for any blood in your urine. Call your health care provider if the amount of blood in your urine increases.  Take over-the-counter and prescription medicines only as told by your health care provider.  Drink enough fluid to keep your urine pale yellow. This information is not intended to replace advice given to you by your health care provider. Make sure you discuss any questions you have with your health care provider. Document Revised: 12/07/2017 Document Reviewed: 12/08/2017 Elsevier Patient Education  2020 [employer].

## 2019-11-16 LAB — URINALYSIS, COMPLETE
Bilirubin, UA: NEGATIVE
Glucose, UA: NEGATIVE
Ketones, UA: NEGATIVE
Nitrite, UA: NEGATIVE
Specific Gravity, UA: 1.015 (ref 1.005–1.030)
Urobilinogen, Ur: 0.2 mg/dL (ref 0.2–1.0)
pH, UA: 7 (ref 5.0–7.5)

## 2019-11-16 LAB — MICROSCOPIC EXAMINATION
Bacteria, UA: NONE SEEN
RBC, Urine: >30 /hpf — AB (ref 0–2)

## 2019-11-27 ENCOUNTER — Other Ambulatory Visit: Payer: Medicare Other

## 2019-11-27 ENCOUNTER — Other Ambulatory Visit: Payer: Self-pay

## 2019-12-04 ENCOUNTER — Other Ambulatory Visit: Payer: Self-pay | Admitting: Urology

## 2020-01-01 ENCOUNTER — Ambulatory Visit
Admission: RE | Admit: 2020-01-01 | Discharge: 2020-01-01 | Disposition: A | Discharge status: Home / Self Care | Payer: Medicare Other | Source: Ambulatory Visit | Attending: Urology | Admitting: Urology

## 2020-01-01 ENCOUNTER — Other Ambulatory Visit: Payer: Self-pay

## 2020-01-01 DIAGNOSIS — N200 Calculus of kidney: Secondary | ICD-10-CM | POA: Insufficient documentation

## 2020-01-02 ENCOUNTER — Ambulatory Visit: Payer: PRIVATE HEALTH INSURANCE | Admitting: Oncology

## 2020-01-02 ENCOUNTER — Other Ambulatory Visit: Payer: PRIVATE HEALTH INSURANCE

## 2020-01-03 ENCOUNTER — Ambulatory Visit (INDEPENDENT_AMBULATORY_CARE_PROVIDER_SITE_OTHER): Payer: Medicare Other | Admitting: Urology

## 2020-01-03 ENCOUNTER — Other Ambulatory Visit: Payer: Self-pay

## 2020-01-03 VITALS — BP 137/82 | HR 68 | Ht 70.0 in | Wt 228.0 lb

## 2020-01-03 DIAGNOSIS — R82994 Hypercalciuria: Secondary | ICD-10-CM

## 2020-01-03 DIAGNOSIS — N200 Calculus of kidney: Secondary | ICD-10-CM | POA: Diagnosis not present

## 2020-01-03 MED ORDER — TRAMADOL HCL 50 MG PO TABS: 50.0000 mg | ORAL_TABLET | Freq: Four times a day (QID) | ORAL | 0 refills | Status: DC | PRN

## 2020-01-03 MED ORDER — POTASSIUM CITRATE ER 15 MEQ (1620 MG) PO TBCR: 2.0000 | EXTENDED_RELEASE_TABLET | Freq: Two times a day (BID) | ORAL | 11 refills | Status: DC

## 2020-01-03 MED ORDER — INDAPAMIDE 2.5 MG PO TABS: 2.5000 mg | ORAL_TABLET | Freq: Every day | ORAL | 5 refills | Status: DC

## 2020-01-03 NOTE — Patient Instructions (Addendum)
[facility] Instructions [facility]  You will receive a box/kit in the mail that will have a urine jug and instructions in the kit.  When the box arrives you will need to call our office [phone] to schedule a LAB appointment.  You will need to do a 24hour urine and this should be done during the days that our office will be open.  For example any day from Sunday through Thursday.  If you take Vitamin C 100mg or greater please stop this 5 days prior to collection.  How to collect the urine sample: On the day you start the urine sample this 1st morning urine should NOT be collected.  For the rest of the day including all night urines should be collected.  On the next morning the 1st urine should be collected and then you will be finished with the urine collections.  You will need to bring the box with you on your LAB appointment day after urine has been collected and all instructions are complete in the box.  Your blood will be drawn and the box will be collected by our Lab employee to be sent off for analysis.  When urine and blood is complete you will need to schedule a follow up appointment for lab results.  Dietary Guidelines to Help Prevent Kidney Stones Kidney stones are deposits of minerals and salts that form inside your kidneys. Your risk of developing kidney stones may be greater depending on your diet, your lifestyle, the medicines you take, and whether you have certain medical conditions. Most people can reduce their chances of developing kidney stones by following the instructions below. Depending on your overall health and the type of kidney stones you tend to develop, your dietitian may give you more specific instructions. What are tips for following this plan? Reading food labels  Choose foods with "no salt added" or "low-salt" labels. Limit your sodium intake to less than 1500 mg per day.  Choose foods with calcium for each meal and snack. Try to eat  about 300 mg of calcium at each meal. Foods that contain 200-500 mg of calcium per serving include: ? 8 oz (237 ml) of milk, fortified nondairy milk, and fortified fruit juice. ? 8 oz (237 ml) of kefir, yogurt, and soy yogurt. ? 4 oz (118 ml) of tofu. ? 1 oz of cheese. ? 1 cup (300 g) of dried figs. ? 1 cup (91 g) of cooked broccoli. ? 1-3 oz can of sardines or mackerel.  Most people need 1000 to 1500 mg of calcium each day. Talk to your dietitian about how much calcium is recommended for you. Shopping  Buy plenty of fresh fruits and vegetables. Most people do not need to avoid fruits and vegetables, even if they contain nutrients that may contribute to kidney stones.  When shopping for convenience foods, choose: ? Whole pieces of fruit. ? Premade salads with dressing on the side. ? Low-fat fruit and yogurt smoothies.  Avoid buying frozen meals or prepared deli foods.  Look for foods with live cultures, such as yogurt and kefir. Cooking  Do not add salt to food when cooking. Place a salt shaker on the table and allow each person to add his or her own salt to taste.  Use vegetable protein, such as beans, textured vegetable protein (TVP), or tofu instead of meat in pasta, casseroles, and soups. Meal planning   Eat less salt, if told by your dietitian. To do this: ? Avoid eating processed   or premade food. ? Avoid eating fast food.  Eat less animal protein, including cheese, meat, poultry, or fish, if told by your dietitian. To do this: ? Limit the number of times you have meat, poultry, fish, or cheese each week. Eat a diet free of meat at least 2 days a week. ? Eat only one serving each day of meat, poultry, fish, or seafood. ? When you prepare animal protein, cut pieces into small portion sizes. For most meat and fish, one serving is about the size of one deck of cards.  Eat at least 5 servings of fresh fruits and vegetables each day. To do this: ? Keep fruits and vegetables on  hand for snacks. ? Eat 1 piece of fruit or a handful of berries with breakfast. ? Have a salad and fruit at lunch. ? Have two kinds of vegetables at dinner.  Limit foods that are high in a substance called oxalate. These include: ? Spinach. ? Rhubarb. ? Beets. ? Potato chips and french fries. ? Nuts.  If you regularly take a diuretic medicine, make sure to eat at least 1-2 fruits or vegetables high in potassium each day. These include: ? Avocado. ? Banana. ? Orange, prune, carrot, or tomato juice. ? Baked potato. ? Cabbage. ? Beans and split peas. General instructions   Drink enough fluid to keep your urine clear or pale yellow. This is the most important thing you can do.  Talk to your health care provider and dietitian about taking daily supplements. Depending on your health and the cause of your kidney stones, you may be advised: ? Not to take supplements with vitamin C. ? To take a calcium supplement. ? To take a daily probiotic supplement. ? To take other supplements such as magnesium, fish oil, or vitamin B6.  Take all medicines and supplements as told by your health care provider.  Limit alcohol intake to no more than 1 drink a day for nonpregnant women and 2 drinks a day for men. One drink equals 12 oz of beer, 5 oz of wine, or 1 oz of hard liquor.  Lose weight if told by your health care provider. Work with your dietitian to find strategies and an eating plan that works best for you. What foods are not recommended? Limit your intake of the following foods, or as told by your dietitian. Talk to your dietitian about specific foods you should avoid based on the type of kidney stones and your overall health. Grains Breads. Bagels. Rolls. Baked goods. Salted crackers. Cereal. Pasta. Vegetables Spinach. Rhubarb. Beets. Canned vegetables. Pickles. Olives. Meats and other protein foods Nuts. Nut butters. Large portions of meat, poultry, or fish. Salted or cured meats. Deli  meats. Hot dogs. Sausages. Dairy Cheese. Beverages Regular soft drinks. Regular vegetable juice. Seasonings and other foods Seasoning blends with salt. Salad dressings. Canned soups. Soy sauce. Ketchup. Barbecue sauce. Canned pasta sauce. Casseroles. Pizza. Lasagna. Frozen meals. Potato chips. French fries. Summary  You can reduce your risk of kidney stones by making changes to your diet.  The most important thing you can do is drink enough fluid. You should drink enough fluid to keep your urine clear or pale yellow.  Ask your health care provider or dietitian how much protein from animal sources you should eat each day, and also how much salt and calcium you should have each day. This information is not intended to replace advice given to you by your health care provider. Make sure you discuss any   questions you have with your health care provider. Document Revised: 06/22/2018 Document Reviewed: 02/11/2016 Elsevier Patient Education  2020 Elsevier Inc.  

## 2020-01-03 NOTE — Progress Notes (Signed)
   01/03/2020 3:44 PM   Maureen Brown 07/25/1950 [id number]  Reason for visit: Follow up nephrolithiasis  HPI: I saw Maureen Brown in urology clinic for follow-up of nephrolithiasis and to review 24-hour urine.  She is a 69 year old female with an extensive history of stone disease previously followed by Dr. Jacqlyn Larsen and Dr. Bernardo Heater who has been on potassium citrate long-term for stone prevention.  She recently underwent left ureteroscopy, laser lithotripsy, and stent placement on 11/10/2019 for a 1.3 cm left lower pole stone.  Her stent was removed on postop renal ultrasound showed no significant hydronephrosis.  She is doing well since her procedure, and did pass a few small fragments which she brought with her today.  Her 24-hour urine test is notable for low urine volume of 2.3 L, elevated urine calcium of 325, mildly elevated urine oxalate of 46, very low urine citrate of 197, pH 6.3, normal urine sodium of 110.  PTH last year was normal at 8.  We reviewed her 24-hour urine results at length.  I recommended an increase in her potassium citrate dose to 30 mEq twice daily, as well as adding indapamide 2.5 mg daily for her persistent hypercalciuria.  We discussed the risk, benefits, and alternatives at length.  RTC 6 months with KUB and repeat 24-hour urine  I spent 30 total minutes on the day of the encounter including pre-visit review of the medical record, face-to-face time with the patient, and post visit ordering of labs/imaging/tests.   Billey Co, [facility] [address], [place], [place] [address] [phone]

## 2020-01-08 ENCOUNTER — Telehealth: Payer: Self-pay

## 2020-01-08 LAB — CALCULI, WITH PHOTOGRAPH (CLINICAL LAB)
CaHPO4 (Brushite): 90 %
Calcium Oxalate Monohydrate: 10 %
Weight Calculi: 26 mg

## 2020-01-08 NOTE — Telephone Encounter (Signed)
Called pt informed her of the information below. Pt gave verbal understanding. UA added to appt notes for next visit. Potassium discontinued from med list.

## 2020-01-08 NOTE — Telephone Encounter (Signed)
-----   Message from Billey Co, MD sent at 01/08/2020 10:14 AM EDT ----- Her stone analysis actually showed calcium phosphate.   Please have her STOP the potassium citrate, and just take the indapamide.  She should continue to consume high citrate beverages like lemonade, lemon juice, crystal light lemonade.  Thanks, needs UA at her next visit Nickolas Madrid, MD 01/08/2020

## 2020-02-07 ENCOUNTER — Other Ambulatory Visit: Payer: Self-pay

## 2020-02-07 ENCOUNTER — Ambulatory Visit (INDEPENDENT_AMBULATORY_CARE_PROVIDER_SITE_OTHER): Payer: Medicare Other

## 2020-02-07 ENCOUNTER — Ambulatory Visit
Admission: EM | Admit: 2020-02-07 | Discharge: 2020-02-07 | Disposition: A | Discharge status: Home / Self Care | Payer: Medicare Other | Attending: Emergency Medicine | Admitting: Emergency Medicine

## 2020-02-07 DIAGNOSIS — J0140 Acute pansinusitis, unspecified: Secondary | ICD-10-CM | POA: Insufficient documentation

## 2020-02-07 DIAGNOSIS — N183 Chronic kidney disease, stage 3 unspecified: Secondary | ICD-10-CM | POA: Insufficient documentation

## 2020-02-07 DIAGNOSIS — Z20822 Contact with and (suspected) exposure to covid-19: Secondary | ICD-10-CM | POA: Insufficient documentation

## 2020-02-07 DIAGNOSIS — R911 Solitary pulmonary nodule: Secondary | ICD-10-CM | POA: Insufficient documentation

## 2020-02-07 DIAGNOSIS — J45909 Unspecified asthma, uncomplicated: Secondary | ICD-10-CM | POA: Diagnosis not present

## 2020-02-07 DIAGNOSIS — R059 Cough, unspecified: Secondary | ICD-10-CM

## 2020-02-07 LAB — RESP PANEL BY RT-PCR (FLU A&B, COVID) ARPGX2
Influenza A by PCR: NEGATIVE
Influenza B by PCR: NEGATIVE
SARS Coronavirus 2 by RT PCR: NEGATIVE

## 2020-02-07 MED ORDER — DOXYCYCLINE HYCLATE 100 MG PO CAPS: 100.0000 mg | ORAL_CAPSULE | Freq: Two times a day (BID) | ORAL | 0 refills | Status: AC

## 2020-02-07 MED ORDER — BENZONATATE 200 MG PO CAPS: 200.0000 mg | ORAL_CAPSULE | Freq: Three times a day (TID) | ORAL | 0 refills | Status: DC | PRN

## 2020-02-07 NOTE — Discharge Instructions (Addendum)
Your Covid and flu both came back negative.  Tessalon for the cough, continue Flonase, albuterol 2 puffs from your inhaler using your spacer every 4-6 hours as needed, Mucinex, saline nasal irrigation with a Milta Deiters med rinse and distilled water as often as you want.  Finish the doxycycline, even if you feel better.   Follow-up with your doctor.  You need a chest CT to further characterize this pulmonary nodule in your left lung.

## 2020-02-07 NOTE — ED Provider Notes (Signed)
HPI  SUBJECTIVE:  Maureen Brown is a 69 y.o. female who presents with over 1 week of nasal congestion, postnasal drip, nonproductive cough, headaches, left maxillary sinus pain and pressure and wheezing.  She states that she has felt feverish but has not taken her temperature.  No body aches.  No sore throat.  States that she cannot smell and taste secondary to the nasal congestion.  No facial swelling, upper dental pain.  She reports left-sided intermittent seconds long chest pain described as soreness present with coughing.  No nausea, diaphoresis, radiation of this pain up her neck, down her arm or through to her back.  She does not describe it like pressure or heaviness.  She reports occasional shortness of breath with heavy exertion but states that she does not have to stop and rest.  No nausea, vomiting, abdominal pain.  No known Covid exposure.  She had a second dose of the Moderna vaccine in April.  She has been using her Xopenbex twice daily, but is not using her spacer.  She has also tried Flonase, Mucinex, OTC cold medications and NyQuil.  These have all helped.  States that she is unable to sleep at night secondary to the cough.  She was on Macrobid within the past few months post nephrolithiasis surgery.  No antipyretic in the past 6 hours.  She has a past medical history of chronic kidney disease stage III, obstructing nephrolithiasis, pleural effusion with pneumonia, thyroid cancer, asthma.  No recent steroid use, admissions for her asthma no history of diabetes, hypertension, congestive heart failure.  GYJ:EHUDJ, Tonette Bihari, MD   Past Medical History:  Diagnosis Date  . Anemia   . Anxiety   . Arthritis   . Asthma 1989   WELL CONTROLLED  . Cancer ([place]) 1989   thyroid  . Chest pain   . Cystitis, unspecified   . Depression   . Dyspnea    RELATED TO ASTHMA  . GERD (gastroesophageal reflux disease)    H/O  . Grief reaction 2008  . Herpes labialis   . History of kidney stones    . Hypothyroidism   . Insomnia   . Kidney stone 2014, 2018  . Leg edema   . Leukopenia   . Pleural effusion   . Pneumonia   . PONV (postoperative nausea and vomiting)    HYSTERECTOMY  . 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 Stress incontinence   . Thrombocytopenia ([place])   . Varicose veins of lower extremities with other complications   . Vitamin B 12 deficiency     Past Surgical History:  Procedure Laterality Date  . ABDOMINAL HYSTERECTOMY  1983  . ABLATION SAPHENOUS VEIN W/ RFA Left 09/2012   [facility]  . APPENDECTOMY    . BREAST BIOPSY Right 1973   neg  . BREAST BIOPSY Left 08/26/2017   Papilloma- X clip  . BREAST CYST REMOVAL    . BREAST EXCISIONAL BIOPSY Left 09/20/2017   papalloma  . BREAST LUMPECTOMY WITH NEEDLE LOCALIZATION Left 09/20/2017   Procedure: BREAST LUMPECTOMY WITH NEEDLE LOCALIZATION;  Surgeon: Herbert Pun, MD;  Location: [facility] ORS;  Service: General;  Laterality: Left;  . CHOLECYSTECTOMY  2002  . COLONOSCOPY  2009   Dr. Vira Agar  . COLONOSCOPY WITH PROPOFOL N/A 09/30/2015   Procedure: COLONOSCOPY WITH PROPOFOL;  Surgeon: Manya Silvas, MD;  [facility] ENDOSCOPY;  Service: Endoscopy;  Laterality: N/A;  . CYSTOSCOPY W/ RETROGRADES Bilateral 11/10/2019   Procedure: CYSTOSCOPY WITH RETROGRADE PYELOGRAM;  Surgeon: Billey Co, MD;  Location: [facility] ORS;  Service: Urology;  Laterality: Bilateral;  . CYSTOSCOPY/URETEROSCOPY/HOLMIUM LASER/STENT PLACEMENT Left 11/10/2019   Procedure: CYSTOSCOPY/URETEROSCOPY/HOLMIUM LASER/STENT PLACEMENT;  Surgeon: Billey Co, MD;  Location: [facility] ORS;  Service: Urology;  Laterality: Left;  . KIDNEY STONE SURGERY  2014  . KIDNEY STONE SURGERY  06/27/13, 07-09-16   Dr Jacqlyn Larsen  . KNEE SURGERY Right 1989  . THYROID SURGERY  1988  . TONSILLECTOMY    . TOTAL THYROIDECTOMY  2011  . UPPER GI ENDOSCOPY  2013    Family History  Problem Relation Age of Onset  . Breast cancer Maternal Grandmother   . Breast cancer  Maternal Aunt   . Thyroid cancer Paternal Grandmother   . Heart disease Mother   . Heart disease Father     Social History   Tobacco Use  . Smoking status: Never Smoker  . Smokeless tobacco: Never Used  Vaping Use  . Vaping Use: Never used  Substance Use Topics  . Alcohol use: Yes    Comment: occassional  . Drug use: No    No current facility-administered medications for this encounter.  Current Outpatient Medications:  .  ALPRAZolam (XANAX) 0.25 MG tablet, Take 0.25 mg by mouth every 6 (six) hours as needed for anxiety. , Disp: , Rfl:  .  azelastine (ASTELIN) 0.1 % nasal spray, Place into the nose. , Disp: , Rfl:  .  calcitRIOL (ROCALTROL) 0.5 MCG capsule, Take 1.5 mcg by mouth daily. , Disp: , Rfl:  .  Cholecalciferol 125 MCG (5000 UT) capsule, Take 5,000 Units by mouth daily. , Disp: , Rfl:  .  clobetasol ointment (TEMOVATE) 9.20 %, Apply 1 application topically daily as needed (irritation). , Disp: , Rfl:  .  Cyanocobalamin (VITAMIN B 12 PO), Take 1,000 mcg by mouth every other day. , Disp: , Rfl:  .  dapsone 25 MG tablet, Take 25 mg by mouth every morning. , Disp: , Rfl:  .  ferrous gluconate (FERGON) 240 (27 FE) MG tablet, Take 1 tablet by mouth as needed. , Disp: , Rfl:  .  folic acid (FOLVITE) 1 MG tablet, Take 1 mg by mouth daily. , Disp: , Rfl:  .  furosemide (LASIX) 20 MG tablet, Take 20 mg by mouth daily as needed. , Disp: , Rfl:  .  indapamide (LOZOL) 2.5 MG tablet, Take 1 tablet (2.5 mg total) by mouth daily., Disp: 30 tablet, Rfl: 5 .  levalbuterol (XOPENEX HFA) 45 MCG/ACT inhaler, Inhale 2 puffs into the lungs 2 (two) times daily as needed for wheezing. , Disp: , Rfl:  .  pyridoxine (B-6) 100 MG tablet, Take 100 mg by mouth daily., Disp: , Rfl:  .  SYNTHROID 137 MCG tablet, Take 68.5-137 mcg by mouth See admin instructions. Take 137 mcg by mouth daily except on Saturday take 68.5 mcg, Disp: , Rfl:  .  tamsulosin (FLOMAX) 0.4 MG CAPS capsule, Take 0.4 mg by mouth  daily after breakfast. , Disp: , Rfl:  .  tiZANidine (ZANAFLEX) 4 MG tablet, Take 4 mg by mouth every 8 (eight) hours as needed for muscle spasms. , Disp: , Rfl:  .  traMADol (ULTRAM) 50 MG tablet, Take 1 tablet (50 mg total) by mouth every 6 (six) hours as needed for severe pain., Disp: 10 tablet, Rfl: 0 .  valACYclovir (VALTREX) 1000 MG tablet, Take 1,000 mg by mouth 2 (two) times daily as needed (outbreaks). , Disp: , Rfl:  .  VENTOLIN HFA  108 (90 BASE) MCG/ACT inhaler, Inhale 1 puff into the lungs every 6 (six) hours as needed for wheezing or shortness of breath. , Disp: , Rfl:  .  vitamin B-12 (CYANOCOBALAMIN) 1000 MCG tablet, Take 1,000 mcg by mouth every other day., Disp: , Rfl:  .  benzonatate (TESSALON) 200 MG capsule, Take 1 capsule (200 mg total) by mouth 3 (three) times daily as needed for cough., Disp: 30 capsule, Rfl: 0 .  doxycycline (VIBRAMYCIN) 100 MG capsule, Take 1 capsule (100 mg total) by mouth 2 (two) times daily for 10 days., Disp: 20 capsule, Rfl: 0  Allergies  Allergen Reactions  . Codeine Nausea Only    dizziness  . Hydrochlorothiazide     Unknown  . Hydrocodone-Acetaminophen Nausea And Vomiting and Other (See Comments)    Loss of balance, causes falls  . Sulfa Antibiotics Hives  . Advair Diskus [Fluticasone-Salmeterol] Rash  . Augmentin [Amoxicillin-Pot Clavulanate] Rash    Has patient had a PCN reaction causing immediate rash, facial/tongue/throat swelling, SOB or lightheadedness with hypotension: No Has patient had a PCN reaction causing severe rash involving mucus membranes or skin necrosis: No Has patient had a PCN reaction that required hospitalization: No Has patient had a PCN reaction occurring within the last 10 years: No If all of the above answers are "NO", then may proceed with Cephalosporin use.   . Cephalosporins Rash  . Ciprofloxacin Rash  . Demerol [Meperidine] Rash    Throat itching  . Keflex [Cephalexin] Rash  . Morphine And Related Rash      ROS  As noted in HPI.   Physical Exam  BP 127/76 (BP Location: Right Arm)   Pulse 73   Temp 98.1 F (36.7 C) (Oral)   Resp 18   Ht 5\' 10"  (1.778 m)   Wt 99.8 kg   SpO2 99%   BMI 31.57 kg/m   Constitutional: Well developed, well nourished, no acute distress Eyes:  EOMI, conjunctiva normal bilaterally HENT: Normocephalic, atraumatic,mucus membranes moist.  Positive left maxillary, frontal sinus tenderness.  Positive nasal congestion.  Normal turbinates.  Positive postnasal drip. Respiratory: Normal inspiratory effort, wheezing left side.  No anterior lateral chest wall tenderness.  Fair air movement. Cardiovascular: Normal rate no murmurs rubs or gallops GI: nondistended skin: No rash, skin intact Musculoskeletal: no deformities Neurologic: Alert & oriented x 3, no focal neuro deficits Psychiatric: Speech and behavior appropriate   ED Course   Medications - No data to display  Orders Placed This Encounter  Procedures  . Resp Panel by RT-PCR (Flu A&B, Covid) Nasopharyngeal Swab    Standing Status:   Standing    Number of Occurrences:   1    Order Specific Question:   Is this test for diagnosis or screening    Answer:   Diagnosis of ill patient    Order Specific Question:   Symptomatic for COVID-19 as defined by CDC    Answer:   Yes    Order Specific Question:   Date of Symptom Onset    Answer:   01/31/2020    Order Specific Question:   Hospitalized for COVID-19    Answer:   No    Order Specific Question:   Admitted to ICU for COVID-19    Answer:   No    Order Specific Question:   Previously tested for COVID-19    Answer:   Yes    Order Specific Question:   Resident in a congregate (group) care setting  Answer:   No    Order Specific Question:   Employed in healthcare setting    Answer:   No    Order Specific Question:   Pregnant    Answer:   No    Order Specific Question:   Has patient completed COVID vaccination(s) (2 doses of Pfizer/Moderna 1 dose of  [employer])    Answer:   Yes  . DG Chest 2 View    Standing Status:   Standing    Number of Occurrences:   1    Order Specific Question:   Reason for Exam (SYMPTOM  OR DIAGNOSIS REQUIRED)    Answer:   r/o PNA, effusion  . Airborne precautions    Standing Status:   Standing    Number of Occurrences:   1    Results for orders placed or performed during the hospital encounter of 02/07/20 (from the past 24 hour(s))  Resp Panel by RT-PCR (Flu A&B, Covid) Nasopharyngeal Swab     Status: None   Collection Time: 02/07/20  2:06 PM   Specimen: Nasopharyngeal Swab; Nasopharyngeal(NP) swabs in vial transport medium  Result Value Ref Range   SARS Coronavirus 2 by RT PCR NEGATIVE NEGATIVE   Influenza A by PCR NEGATIVE NEGATIVE   Influenza B by PCR NEGATIVE NEGATIVE   DG Chest 2 View  Result Date: 02/07/2020 CLINICAL DATA:  Cough for 1 week EXAM: CHEST - 2 VIEW COMPARISON:  06/26/2010 FINDINGS: Cardiac shadows within normal limits. Mild aortic calcifications are seen. Aortic tortuosity is again noted. The lungs are well aerated bilaterally. Somewhat ovoid density measuring 13 mm in greatest dimension is identified in the left suprahilar region which may be vascular in nature although a focal nodule cannot be totally excluded. No bony abnormality is seen. IMPRESSION: Small nodular density as described in the left suprahilar region. CT of the chest is recommended for further evaluation. Electronically Signed   By: Inez Catalina M.D.   On: 02/07/2020 14:31    ED Clinical Impression  1. Acute non-recurrent pansinusitis   2. Cough   3. Solitary pulmonary nodule      ED Assessment/Plan  Checking Covid and flu.  She will be a candidate for monoclonal antibody infusion due to CKD, BMI, asthma.  Also checking chest x-ray given history of pleural effusion/pneumonia.  Calculated creatinine clearance from labs on 8/25/20201 66 mL/min.  Reviewed imaging independently.  13 mm ovoid density left  suprahilar region.  Cannot exclude focal nodule.  Chest CT recommended.  See radiology report for full details.  Flu, Covid negative.  Patient with a sinusitis.  Suspect this is causing her cough.   Patient does not appear to have a classic pneumonia although she does have a pulmonary nodule.   will have patient follow-up with PMD for an outpatient chest CT.  Will send home with Tessalon, continue Flonase, albuterol 2 puffs from her albuterol inhaler using her spacer every 4-6 hours as needed, Mucinex, saline nasal irrigation with a Milta Deiters med rinse and distilled water as often as she wants.  Will send home with doxycycline 100 mg p.o. twice daily for 10 days.  Follow-up with PMD.  To the ER if she gets worse.   Discussed labs, imaging, MDM, treatment plan, and plan for follow-up with patient. Discussed sn/sx that should prompt return to the ED. patient agrees with plan.   Meds ordered this encounter  Medications  . benzonatate (TESSALON) 200 MG capsule    Sig: Take 1 capsule (  200 mg total) by mouth 3 (three) times daily as needed for cough.    Dispense:  30 capsule    Refill:  0  . doxycycline (VIBRAMYCIN) 100 MG capsule    Sig: Take 1 capsule (100 mg total) by mouth 2 (two) times daily for 10 days.    Dispense:  20 capsule    Refill:  0    *This clinic note was created using [employer]. Therefore, there may be occasional mistakes despite careful proofreading.   ?    Melynda Ripple, MD 02/08/20 [phone]

## 2020-02-07 NOTE — ED Triage Notes (Signed)
Patient states that she has been having a cough since last week. States that this has made her chest sore. Reports that she tried to go to primary but couldn't get an appointment.

## 2020-02-14 ENCOUNTER — Other Ambulatory Visit: Payer: Self-pay | Admitting: Internal Medicine

## 2020-02-14 ENCOUNTER — Other Ambulatory Visit (HOSPITAL_COMMUNITY): Payer: Self-pay | Admitting: Internal Medicine

## 2020-02-14 DIAGNOSIS — R911 Solitary pulmonary nodule: Secondary | ICD-10-CM

## 2020-02-23 ENCOUNTER — Other Ambulatory Visit
Admission: RE | Admit: 2020-02-23 | Discharge: 2020-02-23 | Disposition: A | Discharge status: Home / Self Care | Payer: Medicare Other | Source: Ambulatory Visit | Attending: Gastroenterology | Admitting: Gastroenterology

## 2020-02-23 ENCOUNTER — Other Ambulatory Visit: Payer: Self-pay

## 2020-02-23 DIAGNOSIS — Z20822 Contact with and (suspected) exposure to covid-19: Secondary | ICD-10-CM | POA: Insufficient documentation

## 2020-02-23 DIAGNOSIS — Z01812 Encounter for preprocedural laboratory examination: Secondary | ICD-10-CM | POA: Diagnosis present

## 2020-02-24 LAB — SARS CORONAVIRUS 2 (TAT 6-24 HRS): SARS Coronavirus 2: NEGATIVE

## 2020-02-26 ENCOUNTER — Encounter: Payer: Self-pay | Admitting: *Deleted

## 2020-02-27 ENCOUNTER — Ambulatory Visit
Admission: RE | Admit: 2020-02-27 | Discharge: 2020-02-27 | Disposition: A | Discharge status: Home / Self Care | Payer: Medicare Other | Attending: Gastroenterology | Admitting: Gastroenterology

## 2020-02-27 ENCOUNTER — Encounter
Admission: RE | Disposition: A | Discharge status: Home / Self Care | Payer: Self-pay | Source: Home / Self Care | Attending: Gastroenterology

## 2020-02-27 ENCOUNTER — Ambulatory Visit: Payer: Medicare Other | Admitting: Certified Registered Nurse Anesthetist

## 2020-02-27 ENCOUNTER — Encounter: Payer: Self-pay | Admitting: *Deleted

## 2020-02-27 DIAGNOSIS — Z86010 Personal history of colonic polyps: Secondary | ICD-10-CM | POA: Diagnosis not present

## 2020-02-27 DIAGNOSIS — Z79890 Hormone replacement therapy (postmenopausal): Secondary | ICD-10-CM | POA: Insufficient documentation

## 2020-02-27 DIAGNOSIS — D124 Benign neoplasm of descending colon: Secondary | ICD-10-CM | POA: Insufficient documentation

## 2020-02-27 DIAGNOSIS — Z882 Allergy status to sulfonamides status: Secondary | ICD-10-CM | POA: Insufficient documentation

## 2020-02-27 DIAGNOSIS — Z79899 Other long term (current) drug therapy: Secondary | ICD-10-CM | POA: Insufficient documentation

## 2020-02-27 DIAGNOSIS — Z880 Allergy status to penicillin: Secondary | ICD-10-CM | POA: Diagnosis not present

## 2020-02-27 DIAGNOSIS — D122 Benign neoplasm of ascending colon: Secondary | ICD-10-CM | POA: Diagnosis not present

## 2020-02-27 DIAGNOSIS — Z885 Allergy status to narcotic agent status: Secondary | ICD-10-CM | POA: Insufficient documentation

## 2020-02-27 DIAGNOSIS — Z881 Allergy status to other antibiotic agents status: Secondary | ICD-10-CM | POA: Diagnosis not present

## 2020-02-27 DIAGNOSIS — Z09 Encounter for follow-up examination after completed treatment for conditions other than malignant neoplasm: Secondary | ICD-10-CM | POA: Diagnosis not present

## 2020-02-27 HISTORY — PX: COLONOSCOPY: SHX5424

## 2020-02-27 HISTORY — DX: Chronic obstructive pulmonary disease, unspecified: J44.9

## 2020-02-27 SURGERY — COLONOSCOPY
Anesthesia: General

## 2020-02-27 MED ORDER — LIDOCAINE HCL (CARDIAC) PF 100 MG/5ML IV SOSY
PREFILLED_SYRINGE | INTRAVENOUS | Status: DC | PRN
  Administered 2020-02-27: 50 mg via INTRAVENOUS

## 2020-02-27 MED ORDER — SODIUM CHLORIDE 0.9 % IV SOLN: INTRAVENOUS | Status: DC

## 2020-02-27 MED ORDER — PROPOFOL 500 MG/50ML IV EMUL
INTRAVENOUS | Status: DC | PRN
  Administered 2020-02-27: 150 ug/kg/min via INTRAVENOUS

## 2020-02-27 MED ORDER — LIDOCAINE HCL (PF) 2 % IJ SOLN
INTRAMUSCULAR | Status: AC
  Filled 2020-02-27: qty 5

## 2020-02-27 MED ORDER — PROPOFOL 500 MG/50ML IV EMUL
INTRAVENOUS | Status: AC
  Filled 2020-02-27: qty 50

## 2020-02-27 MED ORDER — PROPOFOL 10 MG/ML IV BOLUS
INTRAVENOUS | Status: DC | PRN
  Administered 2020-02-27: 100 mg via INTRAVENOUS

## 2020-02-27 NOTE — H&P (Signed)
Outpatient short stay form Pre-procedure 02/27/2020 12:03 PM Raylene Miyamoto MD, MPH  Primary Physician: Dr. Caryl Comes  Reason for visit:  Surveillance  History of present illness:   69 y/o lady with history of polyps here for surveillance colonoscopy. History of hysterectomy. No family history of GI malignancies. No blood thinners. No new GI symptoms.    Current Facility-Administered Medications:  .  0.9 %  sodium chloride infusion, , Intravenous, Continuous, Sanora Cunanan, Hilton Cork, MD, Last Rate: 20 mL/hr at 02/27/20 1157, New Bag at 02/27/20 1157  Medications Prior to Admission  Medication Sig Dispense Refill Last Dose  . ALPRAZolam (XANAX) 0.25 MG tablet Take 0.25 mg by mouth every 6 (six) hours as needed for anxiety.    02/26/2020 at Unknown time  . azelastine (ASTELIN) 0.1 % nasal spray Place into the nose.    02/26/2020 at Unknown time  . calcitRIOL (ROCALTROL) 0.5 MCG capsule Take 1.5 mcg by mouth daily.    02/26/2020 at Unknown time  . Cholecalciferol 125 MCG (5000 UT) capsule Take 5,000 Units by mouth daily.    02/26/2020 at Unknown time  . Cyanocobalamin (VITAMIN B 12 PO) Take 1,000 mcg by mouth every other day.    02/26/2020 at Unknown time  . dapsone 25 MG tablet Take 25 mg by mouth every morning.   02/26/2020 at Unknown time  . ferrous gluconate (FERGON) 240 (27 FE) MG tablet Take 1 tablet by mouth as needed.    Past Month at Unknown time  . folic acid (FOLVITE) 1 MG tablet Take 1 mg by mouth daily.    02/26/2020 at Unknown time  . furosemide (LASIX) 20 MG tablet Take 20 mg by mouth daily as needed.    Past Week at Unknown time  . indapamide (LOZOL) 2.5 MG tablet Take 1 tablet (2.5 mg total) by mouth daily. 30 tablet 5 02/26/2020 at Unknown time  . levalbuterol (XOPENEX HFA) 45 MCG/ACT inhaler Inhale 2 puffs into the lungs 2 (two) times daily as needed for wheezing.    Past Week at Unknown time  . SYNTHROID 137 MCG tablet Take 68.5-137 mcg by mouth See admin instructions. Take 137  mcg by mouth daily except on Saturday take 68.5 mcg   02/27/2020 at 0700  . tamsulosin (FLOMAX) 0.4 MG CAPS capsule Take 0.4 mg by mouth daily after breakfast.    Past Week at Unknown time  . tiZANidine (ZANAFLEX) 4 MG tablet Take 4 mg by mouth every 8 (eight) hours as needed for muscle spasms.    Past Month at Unknown time  . traMADol (ULTRAM) 50 MG tablet Take 1 tablet (50 mg total) by mouth every 6 (six) hours as needed for severe pain. 10 tablet 0 Past Week at Unknown time  . valACYclovir (VALTREX) 1000 MG tablet Take 1,000 mg by mouth 3 (three) times daily.   Past Week at Unknown time  . VENTOLIN HFA 108 (90 BASE) MCG/ACT inhaler Inhale 1 puff into the lungs every 6 (six) hours as needed for wheezing or shortness of breath.    Past Week at Unknown time  . vitamin B-12 (CYANOCOBALAMIN) 1000 MCG tablet Take 1,000 mcg by mouth every other day.   02/26/2020 at Unknown time  . benzonatate (TESSALON) 200 MG capsule Take 1 capsule (200 mg total) by mouth 3 (three) times daily as needed for cough. 30 capsule 0   . clobetasol ointment (TEMOVATE) 9.76 % Apply 1 application topically daily as needed (irritation).      . pyridoxine (B-6)  100 MG tablet Take 100 mg by mouth daily.        Allergies  Allergen Reactions  . Codeine Nausea Only    dizziness  . Hydrochlorothiazide     Unknown  . Hydrocodone-Acetaminophen Nausea And Vomiting and Other (See Comments)    Loss of balance, causes falls  . Sulfa Antibiotics Hives  . Advair Diskus [Fluticasone-Salmeterol] Rash  . Augmentin [Amoxicillin-Pot Clavulanate] Rash    Has patient had a PCN reaction causing immediate rash, facial/tongue/throat swelling, SOB or lightheadedness with hypotension: No Has patient had a PCN reaction causing severe rash involving mucus membranes or skin necrosis: No Has patient had a PCN reaction that required hospitalization: No Has patient had a PCN reaction occurring within the last 10 years: No If all of the above answers  are "NO", then may proceed with Cephalosporin use.   . Cephalosporins Rash  . Ciprofloxacin Rash  . Demerol [Meperidine] Rash    Throat itching  . Keflex [Cephalexin] Rash  . Morphine And Related Rash     Past Medical History:  Diagnosis Date  . Anemia   . Anxiety   . Arthritis   . Asthma 1989   WELL CONTROLLED  . Cancer ([place]) 1989   thyroid  . Chest pain   . COPD (chronic obstructive pulmonary disease) ([place])   . Cystitis, unspecified   . Depression   . Dyspnea    RELATED TO ASTHMA  . GERD (gastroesophageal reflux disease)    H/O  . Grief reaction 2008  . Herpes labialis   . History of kidney stones   . Hypothyroidism   . Insomnia   . Kidney stone 2014, 2018  . Leg edema   . Leukopenia   . Pleural effusion   . Pneumonia   . PONV (postoperative nausea and vomiting)    HYSTERECTOMY  . 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 Stress incontinence   . Thrombocytopenia ([place])   . Varicose veins of lower extremities with other complications   . Vitamin B 12 deficiency     Review of systems:  Otherwise negative.    Physical Exam  Gen: Alert, oriented. Appears stated age.  HEENT:  PERRLA. Lungs: No respiratory distress CV: RRR Abd: soft, benign, no masses Ext: No edema    Planned procedures: Proceed with colonoscopy. The patient understands the nature of the planned procedure, indications, risks, alternatives and potential complications including but not limited to bleeding, infection, perforation, damage to internal organs and possible oversedation/side effects from anesthesia. The patient agrees and gives consent to proceed.  Please refer to procedure notes for findings, recommendations and patient disposition/instructions.     Raylene Miyamoto MD, MPH Gastroenterology 02/27/2020  12:03 PM

## 2020-02-27 NOTE — Transfer of Care (Signed)
Immediate Anesthesia Transfer of Care Note  Patient: Maureen Brown  Procedure(s) Performed: COLONOSCOPY (N/A )  Patient Location: PACU and Endoscopy Unit  Anesthesia Type:General  Level of Consciousness: drowsy  Airway & Oxygen Therapy: Patient Spontanous Breathing  Post-op Assessment: Report given to RN  Post vital signs: stable  Last Vitals:  Vitals Value Taken Time  BP    Temp    Pulse    Resp    SpO2      Last Pain:  Vitals:   02/27/20 1136  TempSrc: Temporal  PainSc: 0-No pain         Complications: No complications documented.

## 2020-02-27 NOTE — Anesthesia Preprocedure Evaluation (Signed)
Anesthesia Evaluation  Patient identified by MRN, date of birth, ID band Patient awake    Reviewed: Allergy & Precautions, H&P , NPO status , Patient's Chart, lab work & pertinent test results, reviewed documented beta blocker date and time   History of Anesthesia Complications (+) PONV and history of anesthetic complications  Airway Mallampati: II  TM Distance: <3 FB Neck ROM: full    Dental  (+) Missing, Chipped   Pulmonary neg shortness of breath, asthma , neg sleep apnea, pneumonia, neg COPD, neg recent URI,    Pulmonary exam normal        Cardiovascular Exercise Tolerance: Good negative cardio ROS Normal cardiovascular exam     Neuro/Psych PSYCHIATRIC DISORDERS negative neurological ROS     GI/Hepatic Neg liver ROS, GERD  ,  Endo/Other  neg diabetesHypothyroidism Morbid obesity  Renal/GU Renal disease (kidney stones)  negative genitourinary   Musculoskeletal  (+) Arthritis ,   Abdominal   Peds  Hematology  (+) Blood dyscrasia, anemia ,   Anesthesia Other Findings Past Medical History:   Asthma                                          1989         Cystitis, unspecified                                        Varicose veins of lower extremities with other*              GERD (gastroesophageal reflux disease)                       Kidney stone                                    2014         Anxiety                                                      Vitamin B 12 deficiency                                      Chest pain                                                   Grief reaction                                  2008         Herpes labialis  Anemia                                                       Hypothyroidism                                               Insomnia                                                     Kidney stone                                                  Leg edema                                                    Leukopenia                                                   Cancer (HCC)                                    1989           Comment:thyroid   Arthritis                                                    Pleural effusion                                             Postsurgical hypoparathyroidism (HCC)                        Stress incontinence                                          Reproductive/Obstetrics negative OB ROS                             Anesthesia Physical  Anesthesia Plan  ASA: III  Anesthesia Plan: General   Post-op Pain Management:    Induction: Intravenous  PONV Risk Score and Plan: Propofol infusion and TIVA  Airway Management Planned: Natural Airway and Nasal  Cannula  Additional Equipment:   Intra-op Plan:   Post-operative Plan:   Informed Consent: I have reviewed the patients History and Physical, chart, labs and discussed the procedure including the risks, benefits and alternatives for the proposed anesthesia with the patient or authorized representative who has indicated his/her understanding and acceptance.     Dental Advisory Given  Plan Discussed with: Anesthesiologist, CRNA and Surgeon  Anesthesia Plan Comments: (Patient consented for risks of anesthesia including but not limited to:  - adverse reactions to medications - risk of airway placement if required - damage to eyes, teeth, lips or other oral mucosa - nerve damage due to positioning  - sore throat or hoarseness - Damage to heart, brain, nerves, lungs, other parts of body or loss of life  Patient voiced understanding.)        Anesthesia Quick Evaluation

## 2020-02-27 NOTE — Anesthesia Postprocedure Evaluation (Signed)
Anesthesia Post Note  Patient: Maureen Brown  Procedure(s) Performed: COLONOSCOPY (N/A )  Patient location during evaluation: Endoscopy Anesthesia Type: General Level of consciousness: awake and alert Pain management: pain level controlled Vital Signs Assessment: post-procedure vital signs reviewed and stable Respiratory status: spontaneous breathing, nonlabored ventilation, respiratory function stable and patient connected to nasal cannula oxygen Cardiovascular status: blood pressure returned to baseline and stable Postop Assessment: no apparent nausea or vomiting Anesthetic complications: no   No complications documented.   Last Vitals:  Vitals:   02/27/20 1307 02/27/20 1317  BP: 122/73 132/74  Pulse:    Resp:    Temp:    SpO2:  100%    Last Pain:  Vitals:   02/27/20 1317  TempSrc:   PainSc: 0-No pain                 Precious Haws Danyel Tobey

## 2020-02-27 NOTE — Interval H&P Note (Signed)
History and Physical Interval Note:  02/27/2020 12:04 PM  Maureen Brown  has presented today for surgery, with the diagnosis of PERSONAL HX.OF COLON POLYPS.  The various methods of treatment have been discussed with the patient and family. After consideration of risks, benefits and other options for treatment, the patient has consented to  Procedure(s): COLONOSCOPY (N/A) as a surgical intervention.  The patient's history has been reviewed, patient examined, no change in status, stable for surgery.  I have reviewed the patient's chart and labs.  Questions were answered to the patient's satisfaction.     [name]  Ok to proceed with colonoscopy

## 2020-02-27 NOTE — Op Note (Signed)
[facility] Gastroenterology Patient Name: Maureen Brown Procedure Date: 02/27/2020 12:04 PM MRN: [id number] Account #: [id number] Date of Birth: 09-14-1950 Admit Type: Outpatient Age: 69 Room: [facility] ENDO ROOM 1 Gender: Female Note Status: Finalized Procedure:             Colonoscopy Indications:           High risk colon cancer surveillance: Personal history                         of multiple (3 or more) adenomas Providers:             Andrey Farmer MD, MD Referring MD:          Ramonita Lab, MD (Referring MD) Medicines:             Monitored Anesthesia Care Complications:         No immediate complications. Estimated blood loss:                         Minimal. Procedure:             Pre-Anesthesia Assessment:                        - Prior to the procedure, a History and Physical was                         performed, and patient medications and allergies were                         reviewed. The patient is competent. The risks and                         benefits of the procedure and the sedation options and                         risks were discussed with the patient. All questions                         were answered and informed consent was obtained.                         Patient identification and proposed procedure were                         verified by the physician, the nurse, the anesthetist                         and the technician in the endoscopy suite. Mental                         Status Examination: alert and oriented. Airway                         Examination: normal oropharyngeal airway and neck                         mobility. Respiratory Examination: clear to  auscultation. CV Examination: normal. Prophylactic                         Antibiotics: The patient does not require prophylactic                         antibiotics. Prior Anticoagulants: The patient has                         taken no previous  anticoagulant or antiplatelet                         agents. ASA Grade Assessment: II - A patient with mild                         systemic disease. After reviewing the risks and                         benefits, the patient was deemed in satisfactory                         condition to undergo the procedure. The anesthesia                         plan was to use monitored anesthesia care (MAC).                         Immediately prior to administration of medications,                         the patient was re-assessed for adequacy to receive                         sedatives. The heart rate, respiratory rate, oxygen                         saturations, blood pressure, adequacy of pulmonary                         ventilation, and response to care were monitored                         throughout the procedure. The physical status of the                         patient was re-assessed after the procedure.                        After obtaining informed consent, the colonoscope was                         passed under direct vision. Throughout the procedure,                         the patient's blood pressure, pulse, and oxygen                         saturations were monitored continuously. The  Colonoscope was introduced through the anus and                         advanced to the the cecum, identified by appendiceal                         orifice and ileocecal valve. The colonoscopy was                         performed without difficulty. The patient tolerated                         the procedure well. The quality of the bowel                         preparation was good. Findings:      The perianal and digital rectal examinations were normal.      A 2 mm polyp was found in the ascending colon. The polyp was sessile.       The polyp was removed with a jumbo cold forceps. Resection and retrieval       were complete. Estimated blood loss was minimal.      A  3 mm polyp was found in the descending colon. The polyp was sessile.       The polyp was removed with a cold snare. Resection and retrieval were       complete. Estimated blood loss was minimal.      The exam was otherwise without abnormality on direct and retroflexion       views. Impression:            - One 2 mm polyp in the ascending colon, removed with                         a jumbo cold forceps. Resected and retrieved.                        - One 3 mm polyp in the descending colon, removed with                         a cold snare. Resected and retrieved.                        - The examination was otherwise normal on direct and                         retroflexion views. Recommendation:        - Discharge patient to home.                        - Resume previous diet.                        - Continue present medications.                        - Await pathology results.                        - Repeat colonoscopy for surveillance based on  pathology results.                        - Return to referring physician as previously                         scheduled. Procedure Code(s):     --- Professional ---                        [phone], Colonoscopy, flexible; with removal of                         tumor(s), polyp(s), or other lesion(s) by snare                         technique                        [id number], 3, Colonoscopy, flexible; with biopsy, single                         or multiple Diagnosis Code(s):     --- Professional ---                        Z86.010, Personal history of colonic polyps                        K63.5, Polyp of colon CPT copyright 2019 [employer]. All rights reserved. The codes documented in this report are preliminary and upon coder review may  be revised to meet current compliance requirements. Andrey Farmer, MD Andrey Farmer MD, MD 02/27/2020 12:47:05 PM Number of Addenda: 0 Note Initiated On:  02/27/2020 12:04 PM Scope Withdrawal Time: 0 hours 9 minutes 12 seconds  Total Procedure Duration: 0 hours 13 minutes 18 seconds  Estimated Blood Loss:  Estimated blood loss was minimal.      [facility]

## 2020-02-28 ENCOUNTER — Encounter: Payer: Self-pay | Admitting: Gastroenterology

## 2020-02-29 LAB — SURGICAL PATHOLOGY

## 2020-03-01 ENCOUNTER — Ambulatory Visit
Admission: RE | Admit: 2020-03-01 | Discharge: 2020-03-01 | Disposition: A | Discharge status: Home / Self Care | Payer: Medicare Other | Source: Ambulatory Visit | Attending: Internal Medicine | Admitting: Internal Medicine

## 2020-03-01 ENCOUNTER — Other Ambulatory Visit: Payer: Self-pay

## 2020-03-01 DIAGNOSIS — R911 Solitary pulmonary nodule: Secondary | ICD-10-CM | POA: Diagnosis not present

## 2020-05-21 ENCOUNTER — Other Ambulatory Visit (INDEPENDENT_AMBULATORY_CARE_PROVIDER_SITE_OTHER): Payer: Self-pay | Admitting: Vascular Surgery

## 2020-05-21 ENCOUNTER — Ambulatory Visit (INDEPENDENT_AMBULATORY_CARE_PROVIDER_SITE_OTHER): Payer: Medicare Other

## 2020-05-21 ENCOUNTER — Encounter (INDEPENDENT_AMBULATORY_CARE_PROVIDER_SITE_OTHER): Payer: Self-pay | Admitting: Vascular Surgery

## 2020-05-21 ENCOUNTER — Other Ambulatory Visit: Payer: Self-pay

## 2020-05-21 ENCOUNTER — Ambulatory Visit (INDEPENDENT_AMBULATORY_CARE_PROVIDER_SITE_OTHER): Payer: Medicare Other | Admitting: Vascular Surgery

## 2020-05-21 VITALS — BP 119/70 | HR 73 | Ht 70.0 in | Wt 224.0 lb

## 2020-05-21 DIAGNOSIS — I83812 Varicose veins of left lower extremities with pain: Secondary | ICD-10-CM | POA: Insufficient documentation

## 2020-05-21 DIAGNOSIS — R600 Localized edema: Secondary | ICD-10-CM

## 2020-05-21 DIAGNOSIS — M7989 Other specified soft tissue disorders: Secondary | ICD-10-CM

## 2020-05-21 DIAGNOSIS — N183 Chronic kidney disease, stage 3 unspecified: Secondary | ICD-10-CM

## 2020-05-21 NOTE — Assessment & Plan Note (Signed)
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  Were going to address her residual venous disease that is amenable to treatment, she will continue her compression and elevation, and we will explore lymphedema pump.

## 2020-05-21 NOTE — Progress Notes (Signed)
Patient ID: EDIT Maureen Brown, female   DOB: 1951-01-05, 70 y.o.   MRN: [id number]  Chief Complaint  Patient presents with  . Follow-up    Add on per JD U/S    HPI Maureen Brown is a 70 y.o. female.  I am asked to see the patient by Dr. 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  Patient has a previous history of venous disease and underwent what sounds like a left great saphenous vein ablation by physician who has since retired a few years ago.  It sounds like he retired shortly after the procedure and she did not undergo a lot of sclerotherapy or secondary procedures after the ablation.  It sounds like this procedure was complicated by phlebitis following the laser ablation.  This did initially help with the pain and swelling in her leg.  Her symptoms are really all on the left leg with only scattered varicosities which are not symptomatic on the right leg currently.  Over the past year or so, she has had increasing varicosities which are painful and problematic on that left leg.  This has been associated with some swelling in the left leg.  She has been diligently wearing her compression stockings, elevating her legs, and takes anti-inflammatories as needed for the discomfort.  She says the last week or so has been the worst for the pain in her varicose veins.  They become more prominent in the medial thigh and going across her knee laterally.  To assess the situation, we performed a venous duplex today.  Her left great saphenous vein is absent from her previous ablation.  There is reflux in the common femoral vein and the popliteal vein.     Past Medical History:  Diagnosis Date  . Anemia   . Anxiety   . Arthritis   . Asthma 1989   WELL CONTROLLED  . Cancer ([place]) 1989   thyroid  . Chest pain   . COPD (chronic obstructive pulmonary disease) ([place])   . Cystitis, unspecified   . Depression   . Dyspnea    RELATED TO ASTHMA  .  GERD (gastroesophageal reflux disease)    H/O  . Grief reaction 2008  . Herpes labialis   . History of kidney stones   . Hypothyroidism   . Insomnia   . Kidney stone 2014, 2018  . Leg edema   . Leukopenia   . Pleural effusion   . Pneumonia   . PONV (postoperative nausea and vomiting)    HYSTERECTOMY  . 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 Stress incontinence   . Thrombocytopenia ([place])   . Varicose veins of lower extremities with other complications   . Vitamin B 12 deficiency     Past Surgical History:  Procedure Laterality Date  . ABDOMINAL HYSTERECTOMY  1983  . ABLATION SAPHENOUS VEIN W/ RFA Left 09/2012   [facility]  . APPENDECTOMY    . BREAST BIOPSY Right 1973   neg  . BREAST BIOPSY Left 08/26/2017   Papilloma- X clip  . BREAST CYST REMOVAL    . BREAST EXCISIONAL BIOPSY Left 09/20/2017   papalloma  . BREAST LUMPECTOMY WITH NEEDLE LOCALIZATION Left 09/20/2017   Procedure: BREAST LUMPECTOMY WITH NEEDLE LOCALIZATION;  Surgeon: Herbert Pun, MD;  Location: [facility] ORS;  Service: General;  Laterality: Left;  . CHOLECYSTECTOMY  2002  . COLONOSCOPY  2009   Dr. Vira Agar  . COLONOSCOPY N/A 02/27/2020  Procedure: COLONOSCOPY;  Surgeon: [name], MD;  Location: [facility] ENDOSCOPY;  Service: Endoscopy;  Laterality: N/A;  . COLONOSCOPY WITH PROPOFOL N/A 09/30/2015   Procedure: COLONOSCOPY WITH PROPOFOL;  Surgeon: Manya Silvas, MD;  Location: [facility] ENDOSCOPY;  Service: Endoscopy;  Laterality: N/A;  . CYSTOSCOPY W/ RETROGRADES Bilateral 11/10/2019   Procedure: CYSTOSCOPY WITH RETROGRADE PYELOGRAM;  Surgeon: Billey Co, MD;  Location: [facility] ORS;  Service: Urology;  Laterality: Bilateral;  . CYSTOSCOPY/URETEROSCOPY/HOLMIUM LASER/STENT PLACEMENT Left 11/10/2019   Procedure: CYSTOSCOPY/URETEROSCOPY/HOLMIUM LASER/STENT PLACEMENT;  Surgeon: Billey Co, MD;  Location: [facility] ORS;  Service: Urology;  Laterality: Left;  . KIDNEY STONE SURGERY  2014  . KIDNEY STONE SURGERY   06/27/13, 07-09-16   Dr Jacqlyn Larsen  . KNEE SURGERY Right 1989  . THYROID SURGERY  1988  . TONSILLECTOMY    . TOTAL THYROIDECTOMY  2011  . UPPER GI ENDOSCOPY  2013     Family History  Problem Relation Age of Onset  . Breast cancer Maternal Grandmother   . Breast cancer Maternal Aunt   . Thyroid cancer Paternal Grandmother   . Heart disease Mother   . Heart disease Father      Social History   Tobacco Use  . Smoking status: Never Smoker  . Smokeless tobacco: Never Used  Vaping Use  . Vaping Use: Never used  Substance Use Topics  . Alcohol use: Yes    Comment: occassional  . Drug use: No    Allergies  Allergen Reactions  . Codeine Nausea Only    dizziness  . Hydrochlorothiazide     Unknown  . Hydrocodone-Acetaminophen Nausea And Vomiting and Other (See Comments)    Loss of balance, causes falls  . Sulfa Antibiotics Hives  . Advair Diskus [Fluticasone-Salmeterol] Rash  . Augmentin [Amoxicillin-Pot Clavulanate] Rash    Has patient had a PCN reaction causing immediate rash, facial/tongue/throat swelling, SOB or lightheadedness with hypotension: No Has patient had a PCN reaction causing severe rash involving mucus membranes or skin necrosis: No Has patient had a PCN reaction that required hospitalization: No Has patient had a PCN reaction occurring within the last 10 years: No If all of the above answers are "NO", then may proceed with Cephalosporin use.   . Cephalosporins Rash  . Ciprofloxacin Rash  . Demerol [Meperidine] Rash    Throat itching  . Keflex [Cephalexin] Rash  . Morphine And Related Rash    Current Outpatient Medications  Medication Sig Dispense Refill  . ALPRAZolam (XANAX) 0.25 MG tablet Take 0.25 mg by mouth every 6 (six) hours as needed for anxiety.     Marland Kitchen azelastine (ASTELIN) 0.1 % nasal spray Place into the nose.     . benzonatate (TESSALON) 200 MG capsule Take 1 capsule (200 mg total) by mouth 3 (three) times daily as needed for cough. 30 capsule 0   . calcitRIOL (ROCALTROL) 0.5 MCG capsule Take 1.5 mcg by mouth daily.     . Cholecalciferol 125 MCG (5000 UT) capsule Take 5,000 Units by mouth daily.     . clobetasol ointment (TEMOVATE) 2.54 % Apply 1 application topically daily as needed (irritation).     . Cyanocobalamin (VITAMIN B 12 PO) Take 1,000 mcg by mouth every other day.     . dapsone 25 MG tablet Take 25 mg by mouth every morning.    . ferrous gluconate (FERGON) 240 (27 FE) MG tablet Take 1 tablet by mouth as needed.     . folic acid (FOLVITE) 1 MG  tablet Take 1 mg by mouth daily.     . furosemide (LASIX) 20 MG tablet Take 20 mg by mouth daily as needed.     . indapamide (LOZOL) 2.5 MG tablet Take 1 tablet (2.5 mg total) by mouth daily. 30 tablet 5  . levalbuterol (XOPENEX HFA) 45 MCG/ACT inhaler Inhale 2 puffs into the lungs 2 (two) times daily as needed for wheezing.     . pyridoxine (B-6) 100 MG tablet Take 100 mg by mouth daily.    Marland Kitchen SYNTHROID 137 MCG tablet Take 68.5-137 mcg by mouth See admin instructions. Take 137 mcg by mouth daily except on Saturday take 68.5 mcg    . tamsulosin (FLOMAX) 0.4 MG CAPS capsule Take 0.4 mg by mouth daily after breakfast.     . tiZANidine (ZANAFLEX) 4 MG tablet Take 4 mg by mouth every 8 (eight) hours as needed for muscle spasms.     . traMADol (ULTRAM) 50 MG tablet Take 1 tablet (50 mg total) by mouth every 6 (six) hours as needed for severe pain. 10 tablet 0  . valACYclovir (VALTREX) 1000 MG tablet Take 1,000 mg by mouth 3 (three) times daily.    . VENTOLIN HFA 108 (90 BASE) MCG/ACT inhaler Inhale 1 puff into the lungs every 6 (six) hours as needed for wheezing or shortness of breath.     . vitamin B-12 (CYANOCOBALAMIN) 1000 MCG tablet Take 1,000 mcg by mouth every other day.     No current facility-administered medications for this visit.      REVIEW OF SYSTEMS (Negative unless checked)  Constitutional: [] Weight loss  [] Fever  [] Chills Cardiac: [] Chest pain   [] Chest pressure    [] Palpitations   [] Shortness of breath when laying flat   [] Shortness of breath at rest   [] Shortness of breath with exertion. Vascular:  [] Pain in legs with walking   [] Pain in legs at rest   [] Pain in legs when laying flat   [] Claudication   [] Pain in feet when walking  [] Pain in feet at rest  [] Pain in feet when laying flat   [] History of DVT   [] Phlebitis   [x] Swelling in legs   [x] Varicose veins   [] Non-healing ulcers Pulmonary:   [] Uses home oxygen   [] Productive cough   [] Hemoptysis   [] Wheeze  [x] COPD   [x] Asthma Neurologic:  [] Dizziness  [] Blackouts   [] Seizures   [] History of stroke   [] History of TIA  [] Aphasia   [] Temporary blindness   [] Dysphagia   [] Weakness or numbness in arms   [] Weakness or numbness in legs Musculoskeletal:  [x] Arthritis   [] Joint swelling   [] Joint pain   [] Low back pain Hematologic:  [] Easy bruising  [] Easy bleeding   [] Hypercoagulable state   [x] Anemic  [] Hepatitis Gastrointestinal:  [] Blood in stool   [] Vomiting blood  [x] Gastroesophageal reflux/heartburn   [] Abdominal pain Genitourinary:  [] Chronic kidney disease   [] Difficult urination  [] Frequent urination  [] Burning with urination   [] Hematuria Skin:  [] Rashes   [] Ulcers   [] Wounds Psychological:  [x] History of anxiety   []  History of major depression.    Physical Exam BP 119/70   Pulse 73   Ht 5\' 10"  (1.778 m)   Wt 224 lb (101.6 kg)   BMI 32.14 kg/m  Gen:  WD/WN, NAD.  Appears younger than stated age Head: [place]/AT, No temporalis wasting.  Ear/Nose/Throat: Hearing grossly intact, nares w/o erythema or drainage, oropharynx w/o Erythema/Exudate Eyes: Conjunctiva clear, sclera non-icteric  Neck: trachea midline.  No JVD.  Pulmonary:  Good air movement, respirations not labored, no use of accessory muscles  Cardiac: RRR, no JVD Vascular: Scattered varicosities present on the right leg.  Diffuse varicosities including large ones in the medial side across the lateral knee and upper calf area and very  prominent spider varicosities on the left leg as well. Vessel Right Left  Radial Palpable Palpable                                   Gastrointestinal:. No masses, surgical incisions, or scars. Musculoskeletal: M/S 5/5 throughout.  Extremities without ischemic changes.  No deformity or atrophy.  No right lower extremity edema, 1+ left lower extremity edema. Neurologic: Sensation grossly intact in extremities.  Symmetrical.  Speech is fluent. Motor exam as listed above. Psychiatric: Judgment intact, Mood & affect appropriate for pt's clinical situation. Dermatologic: No rashes or ulcers noted.  No cellulitis or open wounds.    Radiology No results found.  Labs Recent Results (from the past 2160 hour(s))  SARS CORONAVIRUS 2 (TAT 6-24 HRS) Nasopharyngeal Nasopharyngeal Swab     Status: None   Collection Time: 02/23/20 12:13 PM   Specimen: Nasopharyngeal Swab  Result Value Ref Range   SARS Coronavirus 2 NEGATIVE NEGATIVE    Comment: (NOTE) SARS-CoV-2 target nucleic acids are NOT DETECTED.  The SARS-CoV-2 RNA is generally detectable in upper and lower respiratory specimens during the acute phase of infection. Negative results do not preclude SARS-CoV-2 infection, do not rule out co-infections with other pathogens, and should not be used as the sole basis for treatment or other patient management decisions. Negative results must be combined with clinical observations, patient history, and epidemiological information. The expected result is Negative.  Fact Sheet for Patients: SugarRoll.be  Fact Sheet for Healthcare Providers: https://www.woods-mathews.com/  This test is not yet approved or cleared by the [place] FDA and  has been authorized for detection and/or diagnosis of SARS-CoV-2 by FDA under an Emergency Use Authorization (EUA). This EUA will remain  in effect (meaning this test can be used) for the duration of  the COVID-19 declaration under Se ction 564(b)(1) of the Act, 21 U.S.C. section 360bbb-3(b)(1), unless the authorization is terminated or revoked sooner.  Performed at [facility], [place] [address], [place], [place] [address]   Surgical pathology     Status: None   Collection Time: 02/27/20 12:36 PM  Result Value Ref Range   SURGICAL PATHOLOGY      SURGICAL PATHOLOGY CASE: [id number] PATIENT: [facility] Surgical Pathology Report     Specimen Submitted: A. Colon polyp, ascending; cbx B. Colon polyp, descending; cs  Clinical History: Personal history of colon polyps, Colon polyps      DIAGNOSIS: A. COLON POLYP, ASCENDING; COLD BIOPSY: - SESSILE SERRATED POLYP, INFLAMED. - NEGATIVE FOR DYSPLASIA AND MALIGNANCY. - DEEPER SECTIONS EXAMINED.  B.  COLON POLYP, DESCENDING; COLD SNARE: - TUBULAR ADENOMA. - NEGATIVE FOR HIGH-GRADE DYSPLASIA AND MALIGNANCY.  Comment: The criteria for serrated polyposis syndrome (SPS) have been recently revised. Any histologic subtype of serrated polyp (hyperplastic polyp, sessile serrated polyp with or without high-grade dysplasia, traditional serrated adenoma, and serrated polyp NOS) is included in the final polyp count. The polyp count is cumulative over multiple colonoscopies.  Clinical criteria for the diagnosis of serrated polyposis syndrome (SPS) includes: Cr iterion 1: At least 5 serrated polyps proximal to rectum, all at least 5 mm in size, with 2  or more measuring at least 10 mm; or  Criterion 2: More than 20 serrated polyps of any size distributed throughout the large bowel, with at least 5 located proximal to the rectum.  References: 2019 WHO Classification of Digestive System Tumors, 5th Edition.   GROSS DESCRIPTION: A. Labeled: Ascending colon polyp cbx Received: Formalin Tissue fragment(s): 2 Size: Each 0.4 cm Description: Tan soft tissue fragments Entirely submitted in 1 cassette.  B. Labeled:  Descending colon polyp cold snare Received: Formalin Tissue fragment(s): Multiple Size: Aggregate, 0.8 x 0.5 x 0.2 cm Description: Tan soft tissue fragments Entirely submitted in 1 cassette.   Final Diagnosis performed by Quay Burow, MD.   Electronically signed 02/29/2020 9:21:48AM The electronic signature indicates that the named Attending Pathologist has evaluated the specimen Technical component performed a t [facility], [address] [place], [place] [address] Lab: [phone] Dir: [place], MD, MMM  Professional component performed at [facility], [facility], [place], [place], [place] [address] Lab: [phone] Dir: Dellia Nims. Rubinas, MD     Assessment/Plan:  Varicose veins of leg with pain, left we performed a venous duplex today.  Her left great saphenous vein is absent from her previous ablation.  There is reflux in the common femoral vein and the popliteal vein.  The large varicosity is likely the anterior accessory saphenous vein and has become very large and problematic.  It is too tortuous to consider laser ablation on, and she has previously had laser ablation which did help but now she has had recurrent pain and symptoms.  She is continues to do her appropriate conservative therapy since her laser ablation.  At this point, I would recommend foam sclerotherapy as well as potentially some conventional sclerotherapy for the prominent spider varicosities of the left leg that are painful and causing symptoms after her laser ablation.  I discussed the risks and benefits of the procedure.  I discussed the reason and rationale for the treatment.  The patient voices her understanding and is agreeable to proceed.  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  She will need to continue to wear her compression stockings and elevate her legs.  She is likely developed some  degree of lymphedema from the chronic scarring limb static channels and her postphlebitic syndrome.  After we address her residual symptomatic superficial varicosities, a lymphedema pump may be an excellent adjuvant therapy.  Leg edema This is likely secondary to her venous disease as described above as well as lymphedema from her postphlebitic syndrome and chronic scarring of the lymphatic channels.  Were going to address her residual venous disease that is amenable to treatment, she will continue her compression and elevation, and we will explore lymphedema pump.  CKD (chronic kidney disease) stage 3, GFR 30-59 ml/min [facility]) May worsen LE swelling      Leotis Pain 05/21/2020, 11:38 AM   This note was created with Dragon medical transcription system.  Any errors from dictation are unintentional.

## 2020-05-21 NOTE — Assessment & Plan Note (Signed)
we performed a venous duplex today.  Her left great saphenous vein is absent from her previous ablation.  There is reflux in the common femoral vein and the popliteal vein.  The large varicosity is likely the anterior accessory saphenous vein and has become very large and problematic.  It is too tortuous to consider laser ablation on, and she has previously had laser ablation which did help but now she has had recurrent pain and symptoms.  She is continues to do her appropriate conservative therapy since her laser ablation.  At this point, I would recommend foam sclerotherapy as well as potentially some conventional sclerotherapy for the prominent spider varicosities of the left leg that are painful and causing symptoms after her laser ablation.  I discussed the risks and benefits of the procedure.  I discussed the reason and rationale for the treatment.  The patient voices her understanding and is agreeable to proceed.  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  She will need to continue to wear her compression stockings and elevate her legs.  She is likely developed some degree of lymphedema from the chronic scarring limb static channels and her postphlebitic syndrome.  After we address her residual symptomatic superficial varicosities, a lymphedema pump may be an excellent adjuvant therapy.

## 2020-05-21 NOTE — Assessment & Plan Note (Signed)
May worsen LE swelling 

## 2020-05-21 NOTE — Patient Instructions (Signed)
Sclerotherapy Sclerotherapy is a procedure that is done to improve the appearance of varicose veins and spider veins and to help relieve aching, swelling, cramping, and pain in the legs. Varicose veins are veins that have become enlarged, bulging, and twisted due to a damaged valve that causes blood to collect (pool) in the veins. Spider veins are small varicose veins. Sclerotherapy usually works best for smaller spider and varicose veins. This procedure involves injecting a chemical into the vein to close it off. You may need more than one treatment to close a vein all the way. Sclerotherapy is usually performed on the legs because that is where varicose and spider veins most often occur. Tell a health care provider about:  Any allergies you have.  All medicines you are taking, including vitamins, herbs, eye drops, creams, and over-the-counter medicines.  Any blood disorders you have.  Any surgeries you have had.  Any medical conditions you have.  Whether you are pregnant or may be pregnant. What are the risks? Generally, this is a safe procedure. However, problems may occur, including:  Infection.  Bleeding.  Allergic reactions to medicines or dyes.  Blood clots.  Nerve damage.  Bruising and scarring.  Darkened skin around the area. What happens before the procedure?  Do not use lotions or creams on your legs unless your health care provider approves.  Follow instructions from your health care provider about eating and drinking restrictions.  Do not use any products that contain nicotine or tobacco, such as cigarettes and e-cigarettes. If you need help quitting, ask your health care provider.  Ask your health care provider about: ? Changing or stopping your regular medicines. This is especially important if you are taking diabetes medicines or blood thinners. ? Taking medicines such as aspirin and ibuprofen. These medicines can thin your blood. Do not take these medicines  before your procedure if your health care provider instructs you not to.  You may have an ultrasound of the affected area to check for blood clots and to check blood flow.  In rare cases, you may have an X-ray procedure to check how blood flows through your veins (angiogram). For an angiogram, a dye is injected to outline your veins on X-rays. What happens during the procedure?  To lower your risk of infection: ? Your health care team will wash or sanitize their hands. ? Your skin will be washed with soap. ? Hair may be removed from the treatment area.  A small, thin needle will be used to inject a chemical (sclerosant) into your varicose vein. The sclerosant will irritate the lining of the vein and cause the vein to close below the injection site. You may feel some stinging, burning, or irritation.  The injection may be repeated for more than one varicose vein.  The injection area will be wrapped with elastic bandages. The procedure may vary among health care providers and hospitals.   What happens after the procedure?  Your injection area will be wrapped with elastic bandages. If there is bleeding, the bandages may be changed.  Do not drive until your health care provider approves. You may need to wait 1-2 days before driving.  You will need to wear compression stockings for about a week, or as long as your health care provider recommends. Summary  Sclerotherapy is a procedure that is done to improve the appearance of varicose veins and spider veins and to help relieve aching, swelling, cramping, and pain in the legs.  A small, thin needle   is used to inject a chemical (sclerosant) into a spider vein or varicose vein to close it off.  Elastic bandages will be wrapped around the injection area after the procedure. This information is not intended to replace advice given to you by your health care provider. Make sure you discuss any questions you have with your health care  provider. Document Revised: 07/17/2019 Document Reviewed: 07/17/2019 Elsevier Patient Education  2021 Elsevier Inc.  

## 2020-05-31 ENCOUNTER — Telehealth (INDEPENDENT_AMBULATORY_CARE_PROVIDER_SITE_OTHER): Payer: Self-pay

## 2020-05-31 NOTE — Telephone Encounter (Signed)
Pt called and left a VM on the nurses line wanting to know if her foam sclero had been approved yet I checked with the referral coordinator and she made me aware that it it is in process I called the pt and made her aware

## 2020-06-12 ENCOUNTER — Telehealth (INDEPENDENT_AMBULATORY_CARE_PROVIDER_SITE_OTHER): Payer: Self-pay | Admitting: Vascular Surgery

## 2020-06-19 NOTE — Telephone Encounter (Signed)
Documentation only.

## 2020-06-25 ENCOUNTER — Ambulatory Visit (INDEPENDENT_AMBULATORY_CARE_PROVIDER_SITE_OTHER): Payer: Medicare Other | Admitting: Vascular Surgery

## 2020-06-25 ENCOUNTER — Other Ambulatory Visit: Payer: Self-pay

## 2020-06-25 ENCOUNTER — Encounter (INDEPENDENT_AMBULATORY_CARE_PROVIDER_SITE_OTHER): Payer: Self-pay | Admitting: Vascular Surgery

## 2020-06-25 VITALS — BP 123/78 | HR 76 | Resp 16 | Wt 217.4 lb

## 2020-06-25 DIAGNOSIS — I83812 Varicose veins of left lower extremities with pain: Secondary | ICD-10-CM | POA: Diagnosis not present

## 2020-06-25 DIAGNOSIS — I890 Lymphedema, not elsewhere classified: Secondary | ICD-10-CM | POA: Insufficient documentation

## 2020-06-25 NOTE — Progress Notes (Signed)
Maureen Brown is a 70 y.o.female who presents with painful varicose veins of the left leg  Past Medical History:  Diagnosis Date  . Anemia   . Anxiety   . Arthritis   . Asthma 1989   WELL CONTROLLED  . Cancer ([place]) 1989   thyroid  . Chest pain   . COPD (chronic obstructive pulmonary disease) ([place])   . Cystitis, unspecified   . Depression   . Dyspnea    RELATED TO ASTHMA  . GERD (gastroesophageal reflux disease)    H/O  . Grief reaction 2008  . Herpes labialis   . History of kidney stones   . Hypothyroidism   . Insomnia   . Kidney stone 2014, 2018  . Leg edema   . Leukopenia   . Pleural effusion   . Pneumonia   . PONV (postoperative nausea and vomiting)    HYSTERECTOMY  . 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 Stress incontinence   . Thrombocytopenia ([place])   . Varicose veins of lower extremities with other complications   . Vitamin B 12 deficiency     Past Surgical History:  Procedure Laterality Date  . ABDOMINAL HYSTERECTOMY  1983  . ABLATION SAPHENOUS VEIN W/ RFA Left 09/2012   [facility]  . APPENDECTOMY    . BREAST BIOPSY Right 1973   neg  . BREAST BIOPSY Left 08/26/2017   Papilloma- X clip  . BREAST CYST REMOVAL    . BREAST EXCISIONAL BIOPSY Left 09/20/2017   papalloma  . BREAST LUMPECTOMY WITH NEEDLE LOCALIZATION Left 09/20/2017   Procedure: BREAST LUMPECTOMY WITH NEEDLE LOCALIZATION;  Surgeon: Herbert Pun, MD;  Location: [facility] ORS;  Service: General;  Laterality: Left;  . CHOLECYSTECTOMY  2002  . COLONOSCOPY  2009   Dr. Vira Agar  . COLONOSCOPY N/A 02/27/2020   Procedure: COLONOSCOPY;  Surgeon: [name], MD;  Location: [facility] ENDOSCOPY;  Service: Endoscopy;  Laterality: N/A;  . COLONOSCOPY WITH PROPOFOL N/A 09/30/2015   Procedure: COLONOSCOPY WITH PROPOFOL;  Surgeon: Manya Silvas, MD;  Location: [facility] ENDOSCOPY;  Service: Endoscopy;  Laterality: N/A;  . CYSTOSCOPY W/ RETROGRADES Bilateral 11/10/2019   Procedure: CYSTOSCOPY WITH  RETROGRADE PYELOGRAM;  Surgeon: Billey Co, MD;  Location: [facility] ORS;  Service: Urology;  Laterality: Bilateral;  . CYSTOSCOPY/URETEROSCOPY/HOLMIUM LASER/STENT PLACEMENT Left 11/10/2019   Procedure: CYSTOSCOPY/URETEROSCOPY/HOLMIUM LASER/STENT PLACEMENT;  Surgeon: Billey Co, MD;  Location: [facility] ORS;  Service: Urology;  Laterality: Left;  . KIDNEY STONE SURGERY  2014  . KIDNEY STONE SURGERY  06/27/13, 07-09-16   Dr Jacqlyn Larsen  . KNEE SURGERY Right 1989  . THYROID SURGERY  1988  . TONSILLECTOMY    . TOTAL THYROIDECTOMY  2011  . UPPER GI ENDOSCOPY  2013    Current Outpatient Medications  Medication Sig Dispense Refill  . ALPRAZolam (XANAX) 0.25 MG tablet Take 0.25 mg by mouth every 6 (six) hours as needed for anxiety.     . benzonatate (TESSALON) 200 MG capsule Take 1 capsule (200 mg total) by mouth 3 (three) times daily as needed for cough. 30 capsule 0  . calcitRIOL (ROCALTROL) 0.5 MCG capsule Take 1.5 mcg by mouth daily.     . Cholecalciferol 125 MCG (5000 UT) capsule Take 5,000 Units by mouth daily.     . clobetasol ointment (TEMOVATE) 1.61 % Apply 1 application topically daily as needed (irritation).     . Cyanocobalamin (VITAMIN B 12 PO) Take 1,000 mcg by mouth every other day.     . dapsone 25  MG tablet Take 25 mg by mouth every morning.    . ferrous gluconate (FERGON) 240 (27 FE) MG tablet Take 1 tablet by mouth as needed.     . folic acid (FOLVITE) 1 MG tablet Take 1 mg by mouth daily.     . furosemide (LASIX) 20 MG tablet Take 20 mg by mouth daily as needed.     . indapamide (LOZOL) 2.5 MG tablet Take 1 tablet (2.5 mg total) by mouth daily. 30 tablet 5  . levalbuterol (XOPENEX HFA) 45 MCG/ACT inhaler Inhale 2 puffs into the lungs 2 (two) times daily as needed for wheezing.     . pyridoxine (B-6) 100 MG tablet Take 100 mg by mouth daily.    Marland Kitchen SYNTHROID 137 MCG tablet Take 68.5-137 mcg by mouth See admin instructions. Take 137 mcg by mouth daily except on Saturday take 68.5 mcg     . tamsulosin (FLOMAX) 0.4 MG CAPS capsule Take 0.4 mg by mouth daily after breakfast.     . tiZANidine (ZANAFLEX) 4 MG tablet Take 4 mg by mouth every 8 (eight) hours as needed for muscle spasms.     . traMADol (ULTRAM) 50 MG tablet Take 1 tablet (50 mg total) by mouth every 6 (six) hours as needed for severe pain. 10 tablet 0  . valACYclovir (VALTREX) 1000 MG tablet Take 1,000 mg by mouth 3 (three) times daily.    . VENTOLIN HFA 108 (90 BASE) MCG/ACT inhaler Inhale 1 puff into the lungs every 6 (six) hours as needed for wheezing or shortness of breath.     . vitamin B-12 (CYANOCOBALAMIN) 1000 MCG tablet Take 1,000 mcg by mouth every other day.    Marland Kitchen azelastine (ASTELIN) 0.1 % nasal spray Place into the nose.      No current facility-administered medications for this visit.    Allergies  Allergen Reactions  . Codeine Nausea Only    dizziness  . Hydrochlorothiazide     Unknown  . Hydrocodone-Acetaminophen Nausea And Vomiting and Other (See Comments)    Loss of balance, causes falls  . Sulfa Antibiotics Hives  . Advair Diskus [Fluticasone-Salmeterol] Rash  . Augmentin [Amoxicillin-Pot Clavulanate] Rash    Has patient had a PCN reaction causing immediate rash, facial/tongue/throat swelling, SOB or lightheadedness with hypotension: No Has patient had a PCN reaction causing severe rash involving mucus membranes or skin necrosis: No Has patient had a PCN reaction that required hospitalization: No Has patient had a PCN reaction occurring within the last 10 years: No If all of the above answers are "NO", then may proceed with Cephalosporin use.   . Cephalosporins Rash  . Ciprofloxacin Rash  . Demerol [Meperidine] Rash    Throat itching  . Keflex [Cephalexin] Rash  . Morphine And Related Rash    Indication: Patient presents with symptomatic varicose veins of the left lower extremity.  Procedure: Foam sclerotherapy was performed on the left lower extremity. Using ultrasound guidance, 5  mL of foam Sotradecol was used to inject the varicosities of the left lower extremity. Compression wraps were placed. The patient tolerated the procedure well.

## 2020-06-25 NOTE — Assessment & Plan Note (Signed)
Treating her venous disease now.  Still wearing her compression stockings and elevating her legs.  A lymphedema pump would definitely be a good adjuvant therapy to help control her leg swelling and pain.

## 2020-06-27 ENCOUNTER — Other Ambulatory Visit: Payer: Medicare Other

## 2020-06-27 ENCOUNTER — Other Ambulatory Visit: Payer: Self-pay

## 2020-07-01 ENCOUNTER — Other Ambulatory Visit: Payer: Self-pay | Admitting: Urology

## 2020-07-04 ENCOUNTER — Ambulatory Visit (INDEPENDENT_AMBULATORY_CARE_PROVIDER_SITE_OTHER): Payer: Medicare Other | Admitting: Urology

## 2020-07-04 ENCOUNTER — Ambulatory Visit
Admission: RE | Admit: 2020-07-04 | Discharge: 2020-07-04 | Disposition: A | Discharge status: Home / Self Care | Payer: Medicare Other | Source: Ambulatory Visit | Attending: Urology | Admitting: Urology

## 2020-07-04 ENCOUNTER — Encounter: Payer: Self-pay | Admitting: Urology

## 2020-07-04 ENCOUNTER — Other Ambulatory Visit: Payer: Self-pay

## 2020-07-04 VITALS — BP 111/75 | HR 88 | Ht 70.0 in | Wt 217.0 lb

## 2020-07-04 DIAGNOSIS — N200 Calculus of kidney: Secondary | ICD-10-CM | POA: Diagnosis present

## 2020-07-04 DIAGNOSIS — R3121 Asymptomatic microscopic hematuria: Secondary | ICD-10-CM

## 2020-07-04 NOTE — Progress Notes (Signed)
   07/04/2020 1:18 PM   KYA MAYFIELD 02/10/1951 [id number]  Reason for visit: Follow up recurrent nephrolithiasis  HPI: 70 year old female with extensive history of stone disease previously followed by Dr. Jacqlyn Larsen and Dr. Bernardo Heater.  She had been on potassium citrate long-term for stone prevention.  We recently discontinued this medication when she was found to have calcium phosphate stones, and she had continued to form stones on potassium citrate therapy long-term.  Her most recent surgery was a left ureteroscopy, laser lithotripsy, and stent on 11/10/2019 for a 1.3 cm left lower pole stone.  PTH has been low in the past.  Our last visit, we increased her indapamide to 2.5 mg daily for persistent hypercalciuria with normal urine sodium.  I reviewed her new 24-hour urine results with her today notable for a low urine volume of 2 L, elevated calcium of 388, elevated oxalate of 46, low urine citrate of 193, normal urine sodium of 81.  I recommended she stop the indapamide, as this has not improved her hypercalciuria at all.  I also think it is worth resuming potassium citrate, she has formed new stones on her KUB today.  I stressed the importance of high fluid volume intake with goal of 2.5 L/day.  KUB today shows new bilateral lower pole stones indicating persistent stone formation  Resume potassium citrate 30 mEq twice daily Follow-up 6 months with KUB prior    Billey Co, MD  [facility] [address] [place], [place] [address] [phone]

## 2020-07-11 ENCOUNTER — Telehealth: Payer: Self-pay | Admitting: *Deleted

## 2020-07-11 NOTE — Telephone Encounter (Signed)
-----   Message from Billey Co, MD sent at 07/11/2020 12:31 PM EDT ----- Her x-ray shows some new non blocking kidney stones in the lower poles both sides.  I think it is worth going back on the potassium citrate.  Please prescribe 24mEq twice daily, and RTC in 6 months with KUB prior  Nickolas Madrid, MD 07/11/2020

## 2020-07-11 NOTE — Telephone Encounter (Signed)
Spoke with patient and advised results   

## 2020-07-16 ENCOUNTER — Other Ambulatory Visit: Payer: Self-pay | Admitting: General Surgery

## 2020-07-23 ENCOUNTER — Ambulatory Visit (INDEPENDENT_AMBULATORY_CARE_PROVIDER_SITE_OTHER): Payer: Medicare Other | Admitting: Vascular Surgery

## 2020-07-26 ENCOUNTER — Other Ambulatory Visit: Payer: Self-pay | Admitting: General Surgery

## 2020-07-26 DIAGNOSIS — Z1231 Encounter for screening mammogram for malignant neoplasm of breast: Secondary | ICD-10-CM

## 2020-08-20 ENCOUNTER — Ambulatory Visit (INDEPENDENT_AMBULATORY_CARE_PROVIDER_SITE_OTHER): Payer: Medicare Other | Admitting: Vascular Surgery

## 2020-08-27 ENCOUNTER — Ambulatory Visit
Admission: RE | Admit: 2020-08-27 | Discharge: 2020-08-27 | Disposition: A | Discharge status: Home / Self Care | Payer: Medicare Other | Source: Ambulatory Visit | Attending: General Surgery | Admitting: General Surgery

## 2020-08-27 ENCOUNTER — Other Ambulatory Visit: Payer: Self-pay

## 2020-08-27 DIAGNOSIS — Z1231 Encounter for screening mammogram for malignant neoplasm of breast: Secondary | ICD-10-CM | POA: Insufficient documentation

## 2020-09-17 ENCOUNTER — Ambulatory Visit (INDEPENDENT_AMBULATORY_CARE_PROVIDER_SITE_OTHER): Payer: Medicare Other | Admitting: Vascular Surgery

## 2020-10-11 ENCOUNTER — Encounter (INDEPENDENT_AMBULATORY_CARE_PROVIDER_SITE_OTHER): Payer: Self-pay | Admitting: Vascular Surgery

## 2020-10-11 ENCOUNTER — Other Ambulatory Visit: Payer: Self-pay

## 2020-10-11 ENCOUNTER — Ambulatory Visit (INDEPENDENT_AMBULATORY_CARE_PROVIDER_SITE_OTHER): Payer: Medicare Other | Admitting: Vascular Surgery

## 2020-10-11 VITALS — BP 122/72 | HR 65 | Ht 67.0 in | Wt 217.0 lb

## 2020-10-11 DIAGNOSIS — I83812 Varicose veins of left lower extremities with pain: Secondary | ICD-10-CM | POA: Diagnosis not present
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
## 2020-11-08 ENCOUNTER — Ambulatory Visit (INDEPENDENT_AMBULATORY_CARE_PROVIDER_SITE_OTHER): Payer: Medicare Other | Admitting: Vascular Surgery

## 2020-11-08 ENCOUNTER — Other Ambulatory Visit: Payer: Self-pay

## 2020-11-08 VITALS — BP 146/76 | HR 62 | Ht 70.0 in | Wt 219.0 lb

## 2020-11-08 DIAGNOSIS — I83812 Varicose veins of left lower extremities with pain: Secondary | ICD-10-CM
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
## 2020-12-06 ENCOUNTER — Ambulatory Visit (INDEPENDENT_AMBULATORY_CARE_PROVIDER_SITE_OTHER): Payer: Medicare Other | Admitting: Vascular Surgery

## 2020-12-06 ENCOUNTER — Encounter (INDEPENDENT_AMBULATORY_CARE_PROVIDER_SITE_OTHER): Payer: Self-pay

## 2021-01-01 ENCOUNTER — Encounter: Payer: Self-pay | Admitting: Ophthalmology

## 2021-01-08 ENCOUNTER — Other Ambulatory Visit: Payer: Self-pay

## 2021-01-08 DIAGNOSIS — N200 Calculus of kidney: Secondary | ICD-10-CM

## 2021-01-09 ENCOUNTER — Other Ambulatory Visit: Payer: Self-pay

## 2021-01-09 ENCOUNTER — Ambulatory Visit (INDEPENDENT_AMBULATORY_CARE_PROVIDER_SITE_OTHER): Payer: Medicare Other | Admitting: Urology

## 2021-01-09 ENCOUNTER — Ambulatory Visit
Admission: RE | Admit: 2021-01-09 | Discharge: 2021-01-09 | Disposition: A | Discharge status: Home / Self Care | Payer: Medicare Other | Source: Ambulatory Visit | Attending: Urology | Admitting: Urology

## 2021-01-09 ENCOUNTER — Encounter: Payer: Self-pay | Admitting: Urology

## 2021-01-09 VITALS — BP 139/79 | HR 73 | Ht 70.0 in | Wt 220.0 lb

## 2021-01-09 DIAGNOSIS — N200 Calculus of kidney: Secondary | ICD-10-CM | POA: Insufficient documentation

## 2021-01-09 NOTE — Patient Instructions (Signed)
Dietary Guidelines to Help Prevent Kidney Stones Kidney stones are deposits of minerals and salts that form inside your kidneys. Your risk of developing kidney stones may be greater depending on your diet, your lifestyle, the medicines you take, and whether you have certain medical conditions. Most people can lower their chances of developing kidney stones by following the instructions below. Your dietitian may give you more specific instructions depending on your overall health and the type of kidney stones you tend to develop. What are tips for following this plan? Reading food labels  Choose foods with "no salt added" or "low-salt" labels. Limit your salt (sodium) intake to less than 1,500 mg a day. Choose foods with calcium for each meal and snack. Try to eat about 300 mg of calcium at each meal. Foods that contain 200-500 mg of calcium a serving include: 8 oz (237 mL) of milk, calcium-fortifiednon-dairy milk, and calcium-fortifiedfruit juice. Calcium-fortified means that calcium has been added to these drinks. 8 oz (237 mL) of kefir, yogurt, and soy yogurt. 4 oz (114 g) of tofu. 1 oz (28 g) of cheese. 1 cup (150 g) of dried figs. 1 cup (91 g) of cooked broccoli. One 3 oz (85 g) can of sardines or mackerel. Most people need 1,000-1,500 mg of calcium a day. Talk to your dietitian about how much calcium is recommended for you. Shopping Buy plenty of fresh fruits and vegetables. Most people do not need to avoid fruits and vegetables, even if these foods contain nutrients that may contribute to kidney stones. When shopping for convenience foods, choose: Whole pieces of fruit. Pre-made salads with dressing on the side. Low-fat fruit and yogurt smoothies. Avoid buying frozen meals or prepared deli foods. These can be high in sodium. Look for foods with live cultures, such as yogurt and kefir. Choose high-fiber grains, such as whole-wheat breads, oat bran, and wheat cereals. Cooking Do not add  salt to food when cooking. Place a salt shaker on the table and allow each person to add his or her own salt to taste. Use vegetable protein, such as beans, textured vegetable protein (TVP), or tofu, instead of meat in pasta, casseroles, and soups. Meal planning Eat less salt, if told by your dietitian. To do this: Avoid eating processed or pre-made food. Avoid eating fast food. Eat less animal protein, including cheese, meat, poultry, or fish, if told by your dietitian. To do this: Limit the number of times you have meat, poultry, fish, or cheese each week. Eat a diet free of meat at least 2 days a week. Eat only one serving each day of meat, poultry, fish, or seafood. When you prepare animal protein, cut pieces into small portion sizes. For most meat and fish, one serving is about the size of the palm of your hand. Eat at least five servings of fresh fruits and vegetables each day. To do this: Keep fruits and vegetables on hand for snacks. Eat one piece of fruit or a handful of berries with breakfast. Have a salad and fruit at lunch. Have two kinds of vegetables at dinner. Limit foods that are high in a substance called oxalate. These include: Spinach (cooked), rhubarb, beets, sweet potatoes, and Swiss chard. Peanuts. Potato chips, french fries, and baked potatoes with skin on. Nuts and nut products. Chocolate. If you regularly take a diuretic medicine, make sure to eat at least 1 or 2 servings of fruits or vegetables that are high in potassium each day. These include: Avocado. Banana. Orange, prune,   carrot, or tomato juice. Baked potato. Cabbage. Beans and split peas. Lifestyle  Drink enough fluid to keep your urine pale yellow. This is the most important thing you can do. Spread your fluid intake throughout the day. If you drink alcohol: Limit how much you use to: 0-1 drink a day for women who are not pregnant. 0-2 drinks a day for men. Be aware of how much alcohol is in your  drink. In the [place] one drink equals one 12 oz bottle of beer (355 mL), one 5 oz glass of wine (148 mL), or one 1 oz glass of hard liquor (44 mL). Lose weight if told by your health care provider. Work with your dietitian to find an eating plan and weight loss strategies that work best for you. General information Talk to your health care provider and dietitian about taking daily supplements. You may be told the following depending on your health and the cause of your kidney stones: Not to take supplements with vitamin C. To take a calcium supplement. To take a daily probiotic supplement. To take other supplements such as magnesium, fish oil, or vitamin B6. Take over-the-counter and prescription medicines only as told by your health care provider. These include supplements. What foods should I limit? Limit your intake of the following foods, or eat them as told by your dietitian. Vegetables Spinach. Rhubarb. Beets. Canned vegetables. Pickles. Olives. Baked potatoes with skin. Grains Wheat bran. Baked goods. Salted crackers. Cereals high in sugar. Meats and other proteins Nuts. Nut butters. Large portions of meat, poultry, or fish. Salted, precooked, or cured meats, such as sausages, meat loaves, and hot dogs. Dairy Cheese. Beverages Regular soft drinks. Regular vegetable juice. Seasonings and condiments Seasoning blends with salt. Salad dressings. Soy sauce. Ketchup. Barbecue sauce. Other foods Canned soups. Canned pasta sauce. Casseroles. Pizza. Lasagna. Frozen meals. Potato chips. French fries. The items listed above may not be a complete list of foods and beverages you should limit. Contact a dietitian for more information. What foods should I avoid? Talk to your dietitian about specific foods you should avoid based on the type of kidney stones you have and your overall health. Fruits Grapefruit. The item listed above may not be a complete list of foods and beverages you should  avoid. Contact a dietitian for more information. Summary Kidney stones are deposits of minerals and salts that form inside your kidneys. You can lower your risk of kidney stones by making changes to your diet. The most important thing you can do is drink enough fluid. Drink enough fluid to keep your urine pale yellow. Talk to your dietitian about how much calcium you should have each day, and eat less salt and animal protein as told by your dietitian. This information is not intended to replace advice given to you by your health care provider. Make sure you discuss any questions you have with your health care provider. Document Revised: 02/23/2019 Document Reviewed: 02/23/2019 Elsevier Patient Education  2022 Elsevier Inc.  

## 2021-01-09 NOTE — Progress Notes (Signed)
   01/09/2021 9:29 AM   Maureen Brown [birth year] [id number]  Reason for visit: Follow up nephrolithiasis  HPI: 70 year old female with extensive history of stone disease previously followed by Dr. Jacqlyn Larsen and Dr. Bernardo Heater.  She had been on potassium citrate long-term for stone prevention.  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  Her most recent surgery was a left ureteroscopy, laser lithotripsy, and stent on 11/10/2019 for a 1.3 cm left lower pole stone.  PTH has been low in the past.  She was also previously trialed on indapamide without improvement in her hypercalciuria, and this was discontinued.  Most recent 24-hour urine notable for low urine volume of 2 L, elevated calcium of 388, elevated oxalate of 46, low urine citrate of 193, normal urine sodium of 81, potassium citrate was restarted after this 24-hour urine results with the very low urine citrate.  I personally reviewed her KUB today that shows significant left-sided renal stone burden, and minimal right-sided stone burden.  This is stable from her prior KUB in April 2022.  We discussed options including left ureteroscopy, laser lithotripsy, stent placement, or observation, and she opts for observation.  Return precautions discussed extensively.  I had a frank conversation with the patient that if she has increase in her stone burden on the left side on 25-month follow-up KUB, I would strongly recommend left ureteroscopy.  We discussed general stone prevention strategies including adequate hydration with goal of producing 2.5 L of urine daily, increasing citric acid intake, increasing calcium intake during high oxalate meals, minimizing animal protein, and decreasing salt intake. Information about dietary recommendations given today.   She opts for active  surveillance of significant left renal stone burden Continue potassiums citrate 30 mEq twice daily RTC 6 months Balm, MD  [facility] [address] [place], [place] [address] [phone]

## 2021-01-14 NOTE — Discharge Instructions (Signed)

## 2021-01-15 ENCOUNTER — Ambulatory Visit: Payer: Medicare Other | Admitting: Anesthesiology

## 2021-01-15 ENCOUNTER — Encounter: Payer: Self-pay | Admitting: Ophthalmology

## 2021-01-15 ENCOUNTER — Other Ambulatory Visit: Payer: Self-pay

## 2021-01-15 ENCOUNTER — Encounter
Admission: RE | Disposition: A | Discharge status: Home / Self Care | Payer: Self-pay | Source: Home / Self Care | Attending: Ophthalmology

## 2021-01-15 ENCOUNTER — Ambulatory Visit
Admission: RE | Admit: 2021-01-15 | Discharge: 2021-01-15 | Disposition: A | Discharge status: Home / Self Care | Payer: Medicare Other | Attending: Ophthalmology | Admitting: Ophthalmology

## 2021-01-15 DIAGNOSIS — Z79899 Other long term (current) drug therapy: Secondary | ICD-10-CM | POA: Insufficient documentation

## 2021-01-15 DIAGNOSIS — J449 Chronic obstructive pulmonary disease, unspecified: Secondary | ICD-10-CM | POA: Diagnosis not present

## 2021-01-15 DIAGNOSIS — E039 Hypothyroidism, unspecified: Secondary | ICD-10-CM | POA: Insufficient documentation

## 2021-01-15 DIAGNOSIS — Z881 Allergy status to other antibiotic agents status: Secondary | ICD-10-CM | POA: Insufficient documentation

## 2021-01-15 DIAGNOSIS — Z79890 Hormone replacement therapy (postmenopausal): Secondary | ICD-10-CM | POA: Diagnosis not present

## 2021-01-15 DIAGNOSIS — K219 Gastro-esophageal reflux disease without esophagitis: Secondary | ICD-10-CM | POA: Diagnosis not present

## 2021-01-15 DIAGNOSIS — Z885 Allergy status to narcotic agent status: Secondary | ICD-10-CM | POA: Insufficient documentation

## 2021-01-15 DIAGNOSIS — Z7951 Long term (current) use of inhaled steroids: Secondary | ICD-10-CM | POA: Diagnosis not present

## 2021-01-15 DIAGNOSIS — Z880 Allergy status to penicillin: Secondary | ICD-10-CM | POA: Insufficient documentation

## 2021-01-15 DIAGNOSIS — Z888 Allergy status to other drugs, medicaments and biological substances status: Secondary | ICD-10-CM | POA: Insufficient documentation

## 2021-01-15 DIAGNOSIS — H2511 Age-related nuclear cataract, right eye: Secondary | ICD-10-CM | POA: Insufficient documentation

## 2021-01-15 HISTORY — DX: Motion sickness, initial encounter: T75.3XXA

## 2021-01-15 HISTORY — PX: CATARACT EXTRACTION W/PHACO: SHX586

## 2021-01-15 SURGERY — PHACOEMULSIFICATION, CATARACT, WITH IOL INSERTION
Anesthesia: Monitor Anesthesia Care | Site: Eye | Laterality: Right

## 2021-01-15 MED ORDER — SIGHTPATH DOSE#1 NA HYALUR & NA CHOND-NA HYALUR IO KIT
PACK | INTRAOCULAR | Status: DC | PRN
  Administered 2021-01-15: 1 via OPHTHALMIC

## 2021-01-15 MED ORDER — SIGHTPATH DOSE#1 BSS IO SOLN
INTRAOCULAR | Status: DC | PRN
  Administered 2021-01-15: 82 mL via OPHTHALMIC

## 2021-01-15 MED ORDER — MIDAZOLAM HCL 2 MG/2ML IJ SOLN
INTRAMUSCULAR | Status: DC | PRN
  Administered 2021-01-15: 2 mg via INTRAVENOUS

## 2021-01-15 MED ORDER — FENTANYL CITRATE (PF) 100 MCG/2ML IJ SOLN
INTRAMUSCULAR | Status: DC | PRN
  Administered 2021-01-15: 100 ug via INTRAVENOUS

## 2021-01-15 MED ORDER — ARMC OPHTHALMIC DILATING DROPS
1.0000 "application " | OPHTHALMIC | Status: DC | PRN
  Administered 2021-01-15 (×3): 1 via OPHTHALMIC

## 2021-01-15 MED ORDER — SIGHTPATH DOSE#1 BSS IO SOLN
INTRAOCULAR | Status: DC | PRN
  Administered 2021-01-15: 1 mL via INTRAMUSCULAR

## 2021-01-15 MED ORDER — MOXIFLOXACIN HCL 0.5 % OP SOLN
OPHTHALMIC | Status: DC | PRN
  Administered 2021-01-15: 0.2 mL via OPHTHALMIC

## 2021-01-15 MED ORDER — SIGHTPATH DOSE#1 BSS IO SOLN
INTRAOCULAR | Status: DC | PRN
  Administered 2021-01-15: 15 mL

## 2021-01-15 MED ORDER — BRIMONIDINE TARTRATE-TIMOLOL 0.2-0.5 % OP SOLN
OPHTHALMIC | Status: DC | PRN
  Administered 2021-01-15: 1 [drp] via OPHTHALMIC

## 2021-01-15 MED ORDER — TETRACAINE HCL 0.5 % OP SOLN
1.0000 [drp] | OPHTHALMIC | Status: DC | PRN
  Administered 2021-01-15 (×3): 1 [drp] via OPHTHALMIC

## 2021-01-15 SURGICAL SUPPLY — 16 items
CANNULA ANT/CHMB 27GA (MISCELLANEOUS) IMPLANT
GLOVE SRG 8 PF TXTR STRL LF DI (GLOVE) ×1 IMPLANT
GLOVE SURG ENC TEXT LTX SZ7.5 (GLOVE) ×2 IMPLANT
GLOVE SURG UNDER POLY LF SZ8 (GLOVE) ×2
GOWN STRL REUS W/ TWL LRG LVL3 (GOWN DISPOSABLE) ×2 IMPLANT
GOWN STRL REUS W/TWL LRG LVL3 (GOWN DISPOSABLE) ×4
LENS IOL TECNIS EYHANCE 20.5 (Intraocular Lens) ×2 IMPLANT
MARKER SKIN DUAL TIP RULER LAB (MISCELLANEOUS) ×2 IMPLANT
NEEDLE CAPSULORHEX 25GA (NEEDLE) IMPLANT
NEEDLE FILTER BLUNT 18X 1/2SAF (NEEDLE) ×2
NEEDLE FILTER BLUNT 18X1 1/2 (NEEDLE) ×2 IMPLANT
PACK EYE AFTER SURG (MISCELLANEOUS) IMPLANT
SYR 3ML LL SCALE MARK (SYRINGE) ×4 IMPLANT
SYR TB 1ML LUER SLIP (SYRINGE) ×2 IMPLANT
WATER STERILE IRR 250ML POUR (IV SOLUTION) ×2 IMPLANT
WIPE NON LINTING 3.25X3.25 (MISCELLANEOUS) ×2 IMPLANT

## 2021-01-15 NOTE — Transfer of Care (Signed)
Immediate Anesthesia Transfer of Care Note  Patient: Maureen Brown  Procedure(s) Performed: CATARACT EXTRACTION PHACO AND INTRAOCULAR LENS PLACEMENT (IOC) RIGHT 14.70 01:35.4 (Right: Eye)  Patient Location: PACU  Anesthesia Type: MAC  Level of Consciousness: awake, alert  and patient cooperative  Airway and Oxygen Therapy: Patient Spontanous Breathing and Patient connected to supplemental oxygen  Post-op Assessment: Post-op Vital signs reviewed, Patient's Cardiovascular Status Stable, Respiratory Function Stable, Patent Airway and No signs of Nausea or vomiting  Post-op Vital Signs: Reviewed and stable  Complications: No notable events documented.
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
## 2021-01-15 NOTE — Op Note (Signed)
  LOCATION:  [place]   PREOPERATIVE DIAGNOSIS:    Nuclear sclerotic cataract right eye. H25.11   POSTOPERATIVE DIAGNOSIS:  Nuclear sclerotic cataract right eye.     PROCEDURE:  Phacoemusification with posterior chamber intraocular lens placement of the right eye   ULTRASOUND TIME: Procedure(s): CATARACT EXTRACTION PHACO AND INTRAOCULAR LENS PLACEMENT (IOC) RIGHT 14.70 01:35.4 (Right)  LENS:   Implant Name Type Inv. Item Serial No. Manufacturer Lot No. LRB No. Used Action  LENS IOL TECNIS EYHANCE 20.5 - [id number] Intraocular Lens LENS IOL TECNIS EYHANCE 20.5 [id number]   Right 1 Implanted         SURGEON:  Wyonia Hough, MD   ANESTHESIA:  Topical with tetracaine drops and 2% Xylocaine jelly, augmented with 1% preservative-free intracameral lidocaine.    COMPLICATIONS:  None.   DESCRIPTION OF PROCEDURE:  The patient was identified in the holding room and transported to the operating room and placed in the supine position under the operating microscope.  The right eye was identified as the operative eye and it was prepped and draped in the usual sterile ophthalmic fashion.   A 1 millimeter clear-corneal paracentesis was made at the 12:00 position.  0.5 ml of preservative-free 1% lidocaine was injected into the anterior chamber. The anterior chamber was filled with Viscoat viscoelastic.  A 2.4 millimeter keratome was used to make a near-clear corneal incision at the 9:00 position.  A curvilinear capsulorrhexis was made with a cystotome and capsulorrhexis forceps.  Balanced salt solution was used to hydrodissect and hydrodelineate the nucleus.   Phacoemulsification was then used in stop and chop fashion to remove the lens nucleus and epinucleus.  The remaining cortex was then removed using the irrigation and aspiration handpiece. Provisc was then placed into the capsular bag to distend it for lens placement.  A lens was then injected into the capsular bag.   The remaining viscoelastic was aspirated.   Wounds were hydrated with balanced salt solution.  The anterior chamber was inflated to a physiologic pressure with balanced salt solution.  No wound leaks were noted. Vigamox 0.2 ml of a 1mg  per ml solution was injected into the anterior chamber for a dose of 0.2 mg of intracameral antibiotic at the completion of the case.   Timolol and Brimonidine drops were applied to the eye.  The patient was taken to the recovery room in stable condition without complications of anesthesia or surgery.   Alfio Loescher 01/15/2021, 9:12 AM

## 2021-01-15 NOTE — Anesthesia Preprocedure Evaluation (Addendum)
Anesthesia Evaluation  Patient identified by MRN, date of birth, ID band Patient awake    Reviewed: Allergy & Precautions, NPO status   History of Anesthesia Complications (+) PONV  Airway Mallampati: II  TM Distance: >3 FB     Dental   Pulmonary asthma , COPD,    Pulmonary exam normal        Cardiovascular  Rhythm:Regular Rate:Normal     Neuro/Psych PSYCHIATRIC DISORDERS Anxiety Depression    GI/Hepatic GERD  ,  Endo/Other  Hypothyroidism Hx thyroid cancer  Renal/GU Renal InsufficiencyRenal disease     Musculoskeletal  (+) Arthritis ,   Abdominal   Peds  Hematology  (+) anemia ,   Anesthesia Other Findings   Reproductive/Obstetrics                            Anesthesia Physical Anesthesia Plan  ASA: 3  Anesthesia Plan: MAC   Post-op Pain Management:    Induction: Intravenous  PONV Risk Score and Plan: TIVA, Midazolam and Treatment may vary due to age or medical condition  Airway Management Planned: Natural Airway and Nasal Cannula  Additional Equipment:   Intra-op Plan:   Post-operative Plan:   Informed Consent: I have reviewed the patients History and Physical, chart, labs and discussed the procedure including the risks, benefits and alternatives for the proposed anesthesia with the patient or authorized representative who has indicated his/her understanding and acceptance.       Plan Discussed with: CRNA  Anesthesia Plan Comments:         Anesthesia Quick Evaluation

## 2021-01-15 NOTE — Anesthesia Postprocedure Evaluation (Signed)
Anesthesia Post Note  Patient: Maureen Brown  Procedure(s) Performed: CATARACT EXTRACTION PHACO AND INTRAOCULAR LENS PLACEMENT (IOC) RIGHT 14.70 01:35.4 (Right: Eye)     Patient location during evaluation: PACU Anesthesia Type: MAC Level of consciousness: awake Pain management: pain level controlled Vital Signs Assessment: post-procedure vital signs reviewed and stable Respiratory status: respiratory function stable Cardiovascular status: stable Postop Assessment: no apparent nausea or vomiting Anesthetic complications: no   No notable events documented.  Veda Canning

## 2021-01-16 ENCOUNTER — Encounter: Payer: Self-pay | Admitting: Ophthalmology

## 2021-01-17 ENCOUNTER — Encounter: Payer: Self-pay | Admitting: Ophthalmology

## 2021-01-28 NOTE — Discharge Instructions (Signed)

## 2021-01-29 ENCOUNTER — Ambulatory Visit: Payer: Medicare Other | Admitting: Anesthesiology

## 2021-01-29 ENCOUNTER — Ambulatory Visit
Admission: RE | Admit: 2021-01-29 | Discharge: 2021-01-29 | Disposition: A | Discharge status: Home / Self Care | Payer: Medicare Other | Attending: Ophthalmology | Admitting: Ophthalmology

## 2021-01-29 ENCOUNTER — Encounter
Admission: RE | Disposition: A | Discharge status: Home / Self Care | Payer: Self-pay | Source: Home / Self Care | Attending: Ophthalmology

## 2021-01-29 ENCOUNTER — Other Ambulatory Visit: Payer: Self-pay

## 2021-01-29 DIAGNOSIS — E039 Hypothyroidism, unspecified: Secondary | ICD-10-CM | POA: Diagnosis not present

## 2021-01-29 DIAGNOSIS — H2512 Age-related nuclear cataract, left eye: Secondary | ICD-10-CM | POA: Insufficient documentation

## 2021-01-29 DIAGNOSIS — J449 Chronic obstructive pulmonary disease, unspecified: Secondary | ICD-10-CM | POA: Insufficient documentation

## 2021-01-29 DIAGNOSIS — Z85850 Personal history of malignant neoplasm of thyroid: Secondary | ICD-10-CM | POA: Diagnosis not present

## 2021-01-29 DIAGNOSIS — F419 Anxiety disorder, unspecified: Secondary | ICD-10-CM | POA: Insufficient documentation

## 2021-01-29 DIAGNOSIS — D649 Anemia, unspecified: Secondary | ICD-10-CM | POA: Diagnosis not present

## 2021-01-29 DIAGNOSIS — M1990 Unspecified osteoarthritis, unspecified site: Secondary | ICD-10-CM | POA: Insufficient documentation

## 2021-01-29 HISTORY — PX: CATARACT EXTRACTION W/PHACO: SHX586

## 2021-01-29 SURGERY — PHACOEMULSIFICATION, CATARACT, WITH IOL INSERTION
Anesthesia: Monitor Anesthesia Care | Site: Eye | Laterality: Left

## 2021-01-29 MED ORDER — ACETAMINOPHEN 325 MG PO TABS: 325.0000 mg | ORAL_TABLET | ORAL | Status: DC | PRN

## 2021-01-29 MED ORDER — SIGHTPATH DOSE#1 BSS IO SOLN
INTRAOCULAR | Status: DC | PRN
  Administered 2021-01-29: 15 mL

## 2021-01-29 MED ORDER — BRIMONIDINE TARTRATE-TIMOLOL 0.2-0.5 % OP SOLN
OPHTHALMIC | Status: DC | PRN
  Administered 2021-01-29: 1 [drp] via OPHTHALMIC

## 2021-01-29 MED ORDER — SIGHTPATH DOSE#1 BSS IO SOLN
INTRAOCULAR | Status: DC | PRN
  Administered 2021-01-29: 1 mL

## 2021-01-29 MED ORDER — SIGHTPATH DOSE#1 BSS IO SOLN
INTRAOCULAR | Status: DC | PRN
  Administered 2021-01-29: 56 mL via OPHTHALMIC

## 2021-01-29 MED ORDER — DEXMEDETOMIDINE (PRECEDEX) IN NS 20 MCG/5ML (4 MCG/ML) IV SYRINGE
PREFILLED_SYRINGE | INTRAVENOUS | Status: DC | PRN
  Administered 2021-01-29: 10 ug via INTRAVENOUS

## 2021-01-29 MED ORDER — TETRACAINE HCL 0.5 % OP SOLN
1.0000 [drp] | OPHTHALMIC | Status: DC | PRN
  Administered 2021-01-29 (×3): 1 [drp] via OPHTHALMIC

## 2021-01-29 MED ORDER — SIGHTPATH DOSE#1 NA HYALUR & NA CHOND-NA HYALUR IO KIT
PACK | INTRAOCULAR | Status: DC | PRN
  Administered 2021-01-29: 1 via OPHTHALMIC

## 2021-01-29 MED ORDER — ACETAMINOPHEN 160 MG/5ML PO SOLN: 325.0000 mg | ORAL | Status: DC | PRN

## 2021-01-29 MED ORDER — MOXIFLOXACIN HCL 0.5 % OP SOLN
OPHTHALMIC | Status: DC | PRN
  Administered 2021-01-29: 0.2 mL via OPHTHALMIC

## 2021-01-29 MED ORDER — FENTANYL CITRATE (PF) 100 MCG/2ML IJ SOLN
INTRAMUSCULAR | Status: DC | PRN
  Administered 2021-01-29 (×2): 50 ug via INTRAVENOUS

## 2021-01-29 MED ORDER — ONDANSETRON HCL 4 MG/2ML IJ SOLN
4.0000 mg | Freq: Once | INTRAMUSCULAR | Status: AC | PRN
  Administered 2021-01-29: 4 mg via INTRAVENOUS

## 2021-01-29 MED ORDER — MIDAZOLAM HCL 2 MG/2ML IJ SOLN
INTRAMUSCULAR | Status: DC | PRN
  Administered 2021-01-29: 2 mg via INTRAVENOUS

## 2021-01-29 MED ORDER — ARMC OPHTHALMIC DILATING DROPS
1.0000 "application " | OPHTHALMIC | Status: DC | PRN
  Administered 2021-01-29 (×3): 1 via OPHTHALMIC

## 2021-01-29 SURGICAL SUPPLY — 16 items
CANNULA ANT/CHMB 27GA (MISCELLANEOUS) IMPLANT
GLOVE SRG 8 PF TXTR STRL LF DI (GLOVE) ×1 IMPLANT
GLOVE SURG ENC TEXT LTX SZ7.5 (GLOVE) ×3 IMPLANT
GLOVE SURG UNDER POLY LF SZ8 (GLOVE) ×3
GOWN STRL REUS W/ TWL LRG LVL3 (GOWN DISPOSABLE) ×2 IMPLANT
GOWN STRL REUS W/TWL LRG LVL3 (GOWN DISPOSABLE) ×6
LENS IOL TECNIS EYHANCE 21.0 (Intraocular Lens) ×3 IMPLANT
MARKER SKIN DUAL TIP RULER LAB (MISCELLANEOUS) ×3 IMPLANT
NEEDLE CAPSULORHEX 25GA (NEEDLE) IMPLANT
NEEDLE FILTER BLUNT 18X 1/2SAF (NEEDLE) ×4
NEEDLE FILTER BLUNT 18X1 1/2 (NEEDLE) ×2 IMPLANT
PACK EYE AFTER SURG (MISCELLANEOUS) IMPLANT
SYR 3ML LL SCALE MARK (SYRINGE) ×6 IMPLANT
SYR TB 1ML LUER SLIP (SYRINGE) ×3 IMPLANT
WATER STERILE IRR 250ML POUR (IV SOLUTION) ×3 IMPLANT
WIPE NON LINTING 3.25X3.25 (MISCELLANEOUS) ×3 IMPLANT

## 2021-01-29 NOTE — Anesthesia Preprocedure Evaluation (Signed)
Anesthesia Evaluation  Patient identified by MRN, date of birth, ID band Patient awake    Reviewed: Allergy & Precautions, NPO status   History of Anesthesia Complications (+) PONV  Airway Mallampati: II  TM Distance: >3 FB     Dental   Pulmonary asthma , COPD,    Pulmonary exam normal        Cardiovascular  Rhythm:Regular Rate:Normal     Neuro/Psych PSYCHIATRIC DISORDERS Anxiety Depression    GI/Hepatic GERD  ,  Endo/Other  Hypothyroidism Hx thyroid cancer  Renal/GU Renal InsufficiencyRenal disease     Musculoskeletal  (+) Arthritis ,   Abdominal   Peds  Hematology  (+) anemia ,   Anesthesia Other Findings   Reproductive/Obstetrics                             Anesthesia Physical  Anesthesia Plan  ASA: 3  Anesthesia Plan: MAC   Post-op Pain Management:    Induction: Intravenous  PONV Risk Score and Plan: TIVA, Midazolam and Treatment may vary due to age or medical condition  Airway Management Planned: Natural Airway and Nasal Cannula  Additional Equipment:   Intra-op Plan:   Post-operative Plan:   Informed Consent: I have reviewed the patients History and Physical, chart, labs and discussed the procedure including the risks, benefits and alternatives for the proposed anesthesia with the patient or authorized representative who has indicated his/her understanding and acceptance.       Plan Discussed with: CRNA  Anesthesia Plan Comments:         Anesthesia Quick Evaluation
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
## 2021-01-29 NOTE — Op Note (Signed)
  OPERATIVE NOTE  CRYSTALL DONALDSON [id number] 01/29/2021   PREOPERATIVE DIAGNOSIS:  Nuclear sclerotic cataract left eye. H25.12   POSTOPERATIVE DIAGNOSIS:    Nuclear sclerotic cataract left eye.     PROCEDURE:  Phacoemusification with posterior chamber intraocular lens placement of the left eye  Ultrasound time: Procedure(s): CATARACT EXTRACTION PHACO AND INTRAOCULAR LENS PLACEMENT ([facility]) LEFT 5.02 00:49.8 (Left)  LENS:   Implant Name Type Inv. Item Serial No. Manufacturer Lot No. LRB No. Used Action  LENS IOL TECNIS EYHANCE 21.0 - [id number] Intraocular Lens LENS IOL TECNIS EYHANCE 21.0 [id number] JOHNSON   Left 1 Implanted      SURGEON:  Wyonia Hough, MD   ANESTHESIA:  Topical with tetracaine drops and 2% Xylocaine jelly, augmented with 1% preservative-free intracameral lidocaine.    COMPLICATIONS:  None.   DESCRIPTION OF PROCEDURE:  The patient was identified in the holding room and transported to the operating room and placed in the supine position under the operating microscope.  The left eye was identified as the operative eye and it was prepped and draped in the usual sterile ophthalmic fashion.   A 1 millimeter clear-corneal paracentesis was made at the 1:30 position.  0.5 ml of preservative-free 1% lidocaine was injected into the anterior chamber.  The anterior chamber was filled with Viscoat viscoelastic.  A 2.4 millimeter keratome was used to make a near-clear corneal incision at the 10:30 position.  .  A curvilinear capsulorrhexis was made with a cystotome and capsulorrhexis forceps.  Balanced salt solution was used to hydrodissect and hydrodelineate the nucleus.   Phacoemulsification was then used in stop and chop fashion to remove the lens nucleus and epinucleus.  The remaining cortex was then removed using the irrigation and aspiration handpiece. Provisc was then placed into the capsular bag to distend it for lens placement.  A lens was then injected into the  capsular bag.  The remaining viscoelastic was aspirated.   Wounds were hydrated with balanced salt solution.  The anterior chamber was inflated to a physiologic pressure with balanced salt solution.  No wound leaks were noted. Vigamox 0.2 ml of a 1mg  per ml solution was injected into the anterior chamber for a dose of 0.2 mg of intracameral antibiotic at the completion of the case.   Timolol and Brimonidine drops were applied to the eye.  The patient was taken to the recovery room in stable condition without complications of anesthesia or surgery.  Jabria Loos 01/29/2021, 9:42 AM

## 2021-01-29 NOTE — Anesthesia Postprocedure Evaluation (Signed)
Anesthesia Post Note  Patient: Maureen Brown  Procedure(s) Performed: CATARACT EXTRACTION PHACO AND INTRAOCULAR LENS PLACEMENT ([facility]) LEFT 5.02 00:49.8 (Left: Eye)     Patient location during evaluation: PACU Anesthesia Type: MAC Level of consciousness: awake Pain management: pain level controlled Vital Signs Assessment: post-procedure vital signs reviewed and stable Respiratory status: respiratory function stable Cardiovascular status: stable Postop Assessment: no apparent nausea or vomiting Anesthetic complications: no   No notable events documented.  Veda Canning

## 2021-01-29 NOTE — Transfer of Care (Signed)
Immediate Anesthesia Transfer of Care Note  Patient: Maureen Brown  Procedure(s) Performed: CATARACT EXTRACTION PHACO AND INTRAOCULAR LENS PLACEMENT ([facility]) LEFT 5.02 00:49.8 (Left: Eye)  Patient Location: PACU  Anesthesia Type: MAC  Level of Consciousness: awake, alert  and patient cooperative  Airway and Oxygen Therapy: Patient Spontanous Breathing and Patient connected to supplemental oxygen  Post-op Assessment: Post-op Vital signs reviewed, Patient's Cardiovascular Status Stable, Respiratory Function Stable, Patent Airway and No signs of Nausea or vomiting  Post-op Vital Signs: Reviewed and stable  Complications: No notable events documented.

## 2021-01-29 NOTE — Anesthesia Procedure Notes (Signed)
Procedure Name: MAC Date/Time: 01/29/2021 9:29 AM Performed by: Jeannene Patella, CRNA Pre-anesthesia Checklist: Patient identified, Emergency Drugs available, Suction available, Timeout performed and Patient being monitored Patient Re-evaluated:Patient Re-evaluated prior to induction Oxygen Delivery Method: Nasal cannula Placement Confirmation: positive ETCO2

## 2021-01-30 ENCOUNTER — Encounter: Payer: Self-pay | Admitting: Ophthalmology

## 2021-03-07 ENCOUNTER — Other Ambulatory Visit: Payer: Self-pay

## 2021-03-07 ENCOUNTER — Encounter: Payer: Self-pay | Admitting: Emergency Medicine

## 2021-03-07 ENCOUNTER — Emergency Department: Payer: Medicare Other

## 2021-03-07 ENCOUNTER — Emergency Department
Admission: EM | Admit: 2021-03-07 | Discharge: 2021-03-07 | Disposition: A | Discharge status: Home / Self Care | Payer: Medicare Other | Attending: Emergency Medicine | Admitting: Emergency Medicine

## 2021-03-07 DIAGNOSIS — J449 Chronic obstructive pulmonary disease, unspecified: Secondary | ICD-10-CM | POA: Insufficient documentation

## 2021-03-07 DIAGNOSIS — E039 Hypothyroidism, unspecified: Secondary | ICD-10-CM | POA: Insufficient documentation

## 2021-03-07 DIAGNOSIS — J45909 Unspecified asthma, uncomplicated: Secondary | ICD-10-CM | POA: Insufficient documentation

## 2021-03-07 DIAGNOSIS — M546 Pain in thoracic spine: Secondary | ICD-10-CM | POA: Diagnosis not present

## 2021-03-07 DIAGNOSIS — Z79899 Other long term (current) drug therapy: Secondary | ICD-10-CM | POA: Diagnosis not present

## 2021-03-07 DIAGNOSIS — N12 Tubulo-interstitial nephritis, not specified as acute or chronic: Secondary | ICD-10-CM

## 2021-03-07 DIAGNOSIS — Z7952 Long term (current) use of systemic steroids: Secondary | ICD-10-CM | POA: Diagnosis not present

## 2021-03-07 DIAGNOSIS — N10 Acute tubulo-interstitial nephritis: Secondary | ICD-10-CM | POA: Insufficient documentation

## 2021-03-07 DIAGNOSIS — R109 Unspecified abdominal pain: Secondary | ICD-10-CM | POA: Diagnosis present

## 2021-03-07 LAB — CBC
HCT: 34.2 % — ABNORMAL LOW (ref 36.0–46.0)
Hemoglobin: 11.6 g/dL — ABNORMAL LOW (ref 12.0–15.0)
MCH: 35 pg — ABNORMAL HIGH (ref 26.0–34.0)
MCHC: 33.9 g/dL (ref 30.0–36.0)
MCV: 103.3 fL — ABNORMAL HIGH (ref 80.0–100.0)
Platelets: 138 10*3/uL — ABNORMAL LOW (ref 150–400)
RBC: 3.31 MIL/uL — ABNORMAL LOW (ref 3.87–5.11)
RDW: 12.1 % (ref 11.5–15.5)
WBC: 5.7 10*3/uL (ref 4.0–10.5)
nRBC: 0 % (ref 0.0–0.2)

## 2021-03-07 LAB — URINALYSIS, ROUTINE W REFLEX MICROSCOPIC
Bilirubin Urine: NEGATIVE
Glucose, UA: NEGATIVE mg/dL
Ketones, ur: NEGATIVE mg/dL
Nitrite: POSITIVE — AB
Protein, ur: 100 mg/dL — AB
Specific Gravity, Urine: 1.01 (ref 1.005–1.030)
pH: 6.5 (ref 5.0–8.0)

## 2021-03-07 LAB — URINALYSIS, MICROSCOPIC (REFLEX)

## 2021-03-07 LAB — HEPATIC FUNCTION PANEL
ALT: 23 U/L (ref 0–44)
AST: 34 U/L (ref 15–41)
Albumin: 4.2 g/dL (ref 3.5–5.0)
Alkaline Phosphatase: 40 U/L (ref 38–126)
Bilirubin, Direct: 0.3 mg/dL — ABNORMAL HIGH (ref 0.0–0.2)
Indirect Bilirubin: 1.3 mg/dL — ABNORMAL HIGH (ref 0.3–0.9)
Total Bilirubin: 1.6 mg/dL — ABNORMAL HIGH (ref 0.3–1.2)
Total Protein: 7.5 g/dL (ref 6.5–8.1)

## 2021-03-07 LAB — BASIC METABOLIC PANEL
Anion gap: 5 (ref 5–15)
BUN: 22 mg/dL (ref 8–23)
CO2: 26 mmol/L (ref 22–32)
Calcium: 10.1 mg/dL (ref 8.9–10.3)
Chloride: 103 mmol/L (ref 98–111)
Creatinine, Ser: 1.54 mg/dL — ABNORMAL HIGH (ref 0.44–1.00)
GFR, Estimated: 37 mL/min — ABNORMAL LOW (ref >60)
Glucose, Bld: 142 mg/dL — ABNORMAL HIGH (ref 70–99)
Potassium: 3.7 mmol/L (ref 3.5–5.1)
Sodium: 134 mmol/L — ABNORMAL LOW (ref 135–145)

## 2021-03-07 LAB — LIPASE, BLOOD: Lipase: 35 U/L (ref 11–51)

## 2021-03-07 MED ORDER — FOSFOMYCIN TROMETHAMINE 3 G PO PACK
3.0000 g | PACK | Freq: Once | ORAL | Status: AC
Start: 2021-03-07 — End: 2021-03-07
  Administered 2021-03-07: 14:00:00 3 g via ORAL
  Filled 2021-03-07: qty 3

## 2021-03-07 MED ORDER — ONDANSETRON 4 MG PO TBDP: 4.0000 mg | ORAL_TABLET | Freq: Three times a day (TID) | ORAL | 0 refills | Status: AC | PRN

## 2021-03-07 MED ORDER — AMOXICILLIN 500 MG PO CAPS: 500.0000 mg | ORAL_CAPSULE | Freq: Three times a day (TID) | ORAL | 0 refills | Status: AC

## 2021-03-07 MED ORDER — SODIUM CHLORIDE 0.9 % IV BOLUS
1000.0000 mL | Freq: Once | INTRAVENOUS | Status: AC
  Administered 2021-03-07: 12:00:00 1000 mL via INTRAVENOUS

## 2021-03-07 MED ORDER — ONDANSETRON HCL 4 MG/2ML IJ SOLN
4.0000 mg | Freq: Once | INTRAMUSCULAR | Status: AC
  Administered 2021-03-07: 12:00:00 4 mg via INTRAVENOUS
  Filled 2021-03-07: qty 2

## 2021-03-07 MED ORDER — TRAMADOL HCL 50 MG PO TABS: 50.0000 mg | ORAL_TABLET | Freq: Two times a day (BID) | ORAL | 0 refills | Status: AC | PRN

## 2021-03-07 MED ORDER — FENTANYL CITRATE PF 50 MCG/ML IJ SOSY
50.0000 ug | PREFILLED_SYRINGE | Freq: Once | INTRAMUSCULAR | Status: AC
  Administered 2021-03-07: 12:00:00 50 ug via INTRAVENOUS
  Filled 2021-03-07: qty 1

## 2021-03-07 NOTE — Discharge Instructions (Addendum)
I am concerned that you have an infection in your kidney.  We are going to trial some amoxicillin at home but there is a chance that the infection may not be sensitive to it.  If develop worsening symptoms, fevers please return to the ER immediately.  Otherwise call the urology clinic to get follow-up next week.  Your urine culture results should be available in 3 days.  Keep your phone on you in case we need to call you to see how you are doing.    IMPRESSION: 1. Mild right hydroureteronephrosis with right pelvic and periureteric edema. Multiple tiny stones are seen in the right kidney but no stone is visible in the right ureter or bladder. Imaging features may be related to a recently passed stone. Urinary tract infection could also produce this appearance. 2. Nonobstructing left renal stones. 3. Aortic Atherosclerosis (ICD10-I70.0).
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
## 2021-03-07 NOTE — ED Triage Notes (Signed)
Pt reports sudden onset pf right flank pain and nausea. Pt reports significant hx of kidney stones and states this feels the same.

## 2021-03-07 NOTE — ED Notes (Signed)
Sx since 330 am today.  Says no fever.

## 2021-03-09 LAB — URINE CULTURE: Culture: >=100000 — AB

## 2021-03-11 ENCOUNTER — Telehealth: Payer: Self-pay | Admitting: *Deleted

## 2021-03-11 NOTE — Telephone Encounter (Signed)
Follow-up on after hours call, pt had UTI and was prescribed Cipro and will follow-up with PCP

## 2021-07-10 ENCOUNTER — Ambulatory Visit: Payer: Medicare Other | Admitting: Urology

## 2021-07-11 ENCOUNTER — Other Ambulatory Visit: Payer: Self-pay | Admitting: General Surgery

## 2021-07-11 DIAGNOSIS — Z1231 Encounter for screening mammogram for malignant neoplasm of breast: Secondary | ICD-10-CM

## 2021-08-19 ENCOUNTER — Ambulatory Visit
Admission: RE | Admit: 2021-08-19 | Discharge: 2021-08-19 | Disposition: A | Discharge status: Home / Self Care | Payer: Medicare Other | Attending: Urology | Admitting: Urology

## 2021-08-19 ENCOUNTER — Ambulatory Visit
Admission: RE | Admit: 2021-08-19 | Discharge: 2021-08-19 | Disposition: A | Discharge status: Home / Self Care | Payer: Medicare Other | Source: Ambulatory Visit | Attending: Urology | Admitting: Urology

## 2021-08-19 DIAGNOSIS — N200 Calculus of kidney: Secondary | ICD-10-CM | POA: Insufficient documentation

## 2021-08-20 ENCOUNTER — Ambulatory Visit (INDEPENDENT_AMBULATORY_CARE_PROVIDER_SITE_OTHER): Payer: Medicare Other | Admitting: Urology

## 2021-08-20 ENCOUNTER — Encounter: Payer: Self-pay | Admitting: Urology

## 2021-08-20 VITALS — BP 145/82 | HR 69 | Ht 70.0 in | Wt 230.0 lb

## 2021-08-20 DIAGNOSIS — N200 Calculus of kidney: Secondary | ICD-10-CM | POA: Diagnosis not present

## 2021-08-20 NOTE — Progress Notes (Signed)
   08/20/2021 11:03 AM   Maureen Brown [birth year] [id number]  Reason for visit: Follow up nephrolithiasis  HPI: 71 year old female with extensive history of stone disease previously followed by Dr. Jacqlyn Larsen and Dr. Bernardo Heater.  She had been on potassium citrate long-term for stone prevention.  That medication was previously discontinued when she was found to have calcium phosphate stones, however she actually formed more stones when stopping the potassium citrate and this was resumed at her visit in April 2022.  Her most recent surgery was a left ureteroscopy, laser lithotripsy, and stent on 11/10/2019 for a 1.3 cm left lower pole stone.  PTH has been low in the past.  She was also previously trialed on indapamide without improvement in her hypercalciuria, and this was discontinued.  Most recent 24-hour urine notable for low urine volume of 2 L, elevated calcium of 388, elevated oxalate of 46, low urine citrate of 193, normal urine sodium of 81, potassium citrate was restarted after this 24-hour urine results with the very low urine citrate.  She presented to the ER in December 2022 with right-sided flank pain, and I personally viewed and interpreted the CT showing right-sided hydroureteronephrosis, with no evidence of ureteral stone.  Her pain resolved, suspect she passed a small stone at that time, also was treated for E. coli in the urine, but unclear if was having any UTI type symptoms.  She feels like she has passed a few small stones since that time that were not particularly painful.  I personally viewed and interpreted the KUB today that shows stable left-sided lower pole and midpole stones.  She denies any left-sided flank pain.  We again reviewed options including left ureteroscopy, laser lithotripsy, stent placement for her moderate to significant left-sided stone burden, which she would like to defer with her lack of symptoms.  Risks and benefits discussed.  She would like to continue  observation with ongoing yearly KUB.  We discussed general stone prevention strategies including adequate hydration with goal of producing 2.5 L of urine daily, increasing citric acid intake, increasing calcium intake during high oxalate meals, minimizing animal protein, and decreasing salt intake. Information about dietary recommendations given today.   She opts for active surveillance of significant left renal stone burden Continue potassium citrate 30 mEq twice daily RTC 1 year [place], MD  [place] [address], [place],  [id number] [phone]

## 2021-08-20 NOTE — Patient Instructions (Signed)
Dietary Guidelines to Help Prevent Kidney Stones Kidney stones are deposits of minerals and salts that form inside your kidneys. Your risk of developing kidney stones may be greater depending on your diet, your lifestyle, the medicines you take, and whether you have certain medical conditions. Most people can lower their chances of developing kidney stones by following the instructions below. Your dietitian may give you more specific instructions depending on your overall health and the type of kidney stones you tend to develop. What are tips for following this plan? Reading food labels  Choose foods with "no salt added" or "low-salt" labels. Limit your salt (sodium) intake to less than 1,500 mg a day. Choose foods with calcium for each meal and snack. Try to eat about 300 mg of calcium at each meal. Foods that contain 200-500 mg of calcium a serving include: 8 oz (237 mL) of milk, calcium-fortifiednon-dairy milk, and calcium-fortifiedfruit juice. Calcium-fortified means that calcium has been added to these drinks. 8 oz (237 mL) of kefir, yogurt, and soy yogurt. 4 oz (114 g) of tofu. 1 oz (28 g) of cheese. 1 cup (150 g) of dried figs. 1 cup (91 g) of cooked broccoli. One 3 oz (85 g) can of sardines or mackerel. Most people need 1,000-1,500 mg of calcium a day. Talk to your dietitian about how much calcium is recommended for you. Shopping Buy plenty of fresh fruits and vegetables. Most people do not need to avoid fruits and vegetables, even if these foods contain nutrients that may contribute to kidney stones. When shopping for convenience foods, choose: Whole pieces of fruit. Pre-made salads with dressing on the side. Low-fat fruit and yogurt smoothies. Avoid buying frozen meals or prepared deli foods. These can be high in sodium. Look for foods with live cultures, such as yogurt and kefir. Choose high-fiber grains, such as whole-wheat breads, oat bran, and wheat cereals. Cooking Do not add  salt to food when cooking. Place a salt shaker on the table and allow each person to add his or her own salt to taste. Use vegetable protein, such as beans, textured vegetable protein (TVP), or tofu, instead of meat in pasta, casseroles, and soups. Meal planning Eat less salt, if told by your dietitian. To do this: Avoid eating processed or pre-made food. Avoid eating fast food. Eat less animal protein, including cheese, meat, poultry, or fish, if told by your dietitian. To do this: Limit the number of times you have meat, poultry, fish, or cheese each week. Eat a diet free of meat at least 2 days a week. Eat only one serving each day of meat, poultry, fish, or seafood. When you prepare animal protein, cut pieces into small portion sizes. For most meat and fish, one serving is about the size of the palm of your hand. Eat at least five servings of fresh fruits and vegetables each day. To do this: Keep fruits and vegetables on hand for snacks. Eat one piece of fruit or a handful of berries with breakfast. Have a salad and fruit at lunch. Have two kinds of vegetables at dinner. Limit foods that are high in a substance called oxalate. These include: Spinach (cooked), rhubarb, beets, sweet potatoes, and Swiss chard. Peanuts. Potato chips, french fries, and baked potatoes with skin on. Nuts and nut products. Chocolate. If you regularly take a diuretic medicine, make sure to eat at least 1 or 2 servings of fruits or vegetables that are high in potassium each day. These include: Avocado. Banana. Orange, prune,   carrot, or tomato juice. Baked potato. Cabbage. Beans and split peas. Lifestyle  Drink enough fluid to keep your urine pale yellow. This is the most important thing you can do. Spread your fluid intake throughout the day. If you drink alcohol: Limit how much you use to: 0-1 drink a day for women who are not pregnant. 0-2 drinks a day for men. Be aware of how much alcohol is in your  drink. In the [place] one drink equals one 12 oz bottle of beer (355 mL), one 5 oz glass of wine (148 mL), or one 1 oz glass of hard liquor (44 mL). Lose weight if told by your health care provider. Work with your dietitian to find an eating plan and weight loss strategies that work best for you. General information Talk to your health care provider and dietitian about taking daily supplements. You may be told the following depending on your health and the cause of your kidney stones: Not to take supplements with vitamin C. To take a calcium supplement. To take a daily probiotic supplement. To take other supplements such as magnesium, fish oil, or vitamin B6. Take over-the-counter and prescription medicines only as told by your health care provider. These include supplements. What foods should I limit? Limit your intake of the following foods, or eat them as told by your dietitian. Vegetables Spinach. Rhubarb. Beets. Canned vegetables. Pickles. Olives. Baked potatoes with skin. Grains Wheat bran. Baked goods. Salted crackers. Cereals high in sugar. Meats and other proteins Nuts. Nut butters. Large portions of meat, poultry, or fish. Salted, precooked, or cured meats, such as sausages, meat loaves, and hot dogs. Dairy Cheese. Beverages Regular soft drinks. Regular vegetable juice. Seasonings and condiments Seasoning blends with salt. Salad dressings. Soy sauce. Ketchup. Barbecue sauce. Other foods Canned soups. Canned pasta sauce. Casseroles. Pizza. Lasagna. Frozen meals. Potato chips. French fries. The items listed above may not be a complete list of foods and beverages you should limit. Contact a dietitian for more information. What foods should I avoid? Talk to your dietitian about specific foods you should avoid based on the type of kidney stones you have and your overall health. Fruits Grapefruit. The item listed above may not be a complete list of foods and beverages you should  avoid. Contact a dietitian for more information. Summary Kidney stones are deposits of minerals and salts that form inside your kidneys. You can lower your risk of kidney stones by making changes to your diet. The most important thing you can do is drink enough fluid. Drink enough fluid to keep your urine pale yellow. Talk to your dietitian about how much calcium you should have each day, and eat less salt and animal protein as told by your dietitian. This information is not intended to replace advice given to you by your health care provider. Make sure you discuss any questions you have with your health care provider. Document Revised: 11/11/2020 Document Reviewed: 11/11/2020 Elsevier Patient Education  2023 [employer]  

## 2021-08-21 ENCOUNTER — Telehealth: Payer: Self-pay

## 2021-08-21 ENCOUNTER — Other Ambulatory Visit: Payer: Self-pay | Admitting: Urology

## 2021-08-21 DIAGNOSIS — N200 Calculus of kidney: Secondary | ICD-10-CM

## 2021-08-21 NOTE — Telephone Encounter (Signed)
Patient called and states that after her visit yesterday she spoke with her husband and would like to go ahead with scheduling surgery. She states she has also started "spotting, blood in her urine" and is have left flank pain since leaving yesterday.

## 2021-08-21 NOTE — Progress Notes (Signed)
[facility] Posting Form   Surgery Date/Time: Date: 08/29/2021  Surgeon: Dr. Nickolas Madrid, MD  Surgery Location: Day Surgery  Inpt ( No  )   Outpt (Yes)   Obs ( No  )   Diagnosis: N20.0 Left Nephrolithiasis  -CPT: 79987  Surgery: Left Ureteroscopy with laser lithotripsy and stent placement  Stop Anticoagulations: No, may continue all  Cardiac/Medical/Pulmonary Clearance needed: no  *Orders entered into EPIC  Date: 08/21/21   *Case booked in EPIC  Date: 08/21/21  *Notified pt of Surgery: Date: 08/21/21  PRE-OP UA & CX: yes, will obtain on 08/22/2021  *Placed into Prior Authorization Work Fabio Bering Date: 08/21/21   Assistant/laser/rep:No

## 2021-08-21 NOTE — Telephone Encounter (Signed)
I spoke with Maureen Brown. We have discussed possible surgery dates and Friday June 16th, 2023 was agreed upon by all parties. Patient given information about surgery date, what to expect pre-operatively and post operatively.  We discussed that a Pre-Admission Testing office will be calling to set up the pre-op visit that will take place prior to surgery, and that these appointments are typically done over the phone with a Pre-Admissions RN.  Informed patient that our office will communicate any additional care to be provided after surgery. Patients questions or concerns were discussed during our call. Advised to call our office should there be any additional information, questions or concerns that arise. Patient verbalized understanding.

## 2021-08-21 NOTE — Telephone Encounter (Signed)
Will call to schedule 

## 2021-08-21 NOTE — Progress Notes (Signed)
Surgical Physician Order Form [facility] [place]  * Scheduling expectation : Next Available  *Length of Case: 1.5 hours  *Clearance needed: no  *Anticoagulation Instructions: May continue all anticoagulants  *Aspirin Instructions: Ok to continue all  *Post-op visit Date/Instructions: TBD  *Diagnosis: Left Nephrolithiasis  *Procedure: left Ureteroscopy w/laser lithotripsy & stent placement (74081)   Additional orders: N/A  -Admit type: OUTpatient  -Anesthesia: General  -VTE Prophylaxis Standing Order SCD's       Other:   -Standing Lab Orders Per Anesthesia    Lab other: UA&Urine Culture  -Standing Test orders EKG/Chest x-ray per Anesthesia       Test other:   - Medications:  Ancef 2gm IV  -Other orders:  N/A

## 2021-08-22 ENCOUNTER — Other Ambulatory Visit: Payer: Self-pay

## 2021-08-22 ENCOUNTER — Other Ambulatory Visit: Payer: Medicare Other

## 2021-08-22 DIAGNOSIS — N200 Calculus of kidney: Secondary | ICD-10-CM

## 2021-08-22 LAB — MICROSCOPIC EXAMINATION: WBC, UA: >30 /hpf — AB (ref 0–5)

## 2021-08-22 LAB — URINALYSIS, COMPLETE
Bilirubin, UA: NEGATIVE
Glucose, UA: NEGATIVE
Ketones, UA: NEGATIVE
Nitrite, UA: NEGATIVE
Protein,UA: NEGATIVE
Specific Gravity, UA: 1.015 (ref 1.005–1.030)
Urobilinogen, Ur: 0.2 mg/dL (ref 0.2–1.0)
pH, UA: 6 (ref 5.0–7.5)

## 2021-08-26 ENCOUNTER — Encounter
Admission: RE | Admit: 2021-08-26 | Discharge: 2021-08-26 | Disposition: A | Discharge status: Home / Self Care | Payer: Medicare Other | Source: Ambulatory Visit | Attending: Urology | Admitting: Urology

## 2021-08-26 ENCOUNTER — Telehealth: Payer: Self-pay

## 2021-08-26 ENCOUNTER — Other Ambulatory Visit: Payer: Self-pay

## 2021-08-26 DIAGNOSIS — Z01812 Encounter for preprocedural laboratory examination: Secondary | ICD-10-CM

## 2021-08-26 DIAGNOSIS — B9620 Unspecified Escherichia coli [E. coli] as the cause of diseases classified elsewhere: Secondary | ICD-10-CM

## 2021-08-26 HISTORY — DX: Angina pectoris, unspecified: I20.9

## 2021-08-26 LAB — CULTURE, URINE COMPREHENSIVE

## 2021-08-26 MED ORDER — NITROFURANTOIN MONOHYD MACRO 100 MG PO CAPS: 100.0000 mg | ORAL_CAPSULE | Freq: Two times a day (BID) | ORAL | 0 refills | Status: AC

## 2021-08-26 NOTE — Telephone Encounter (Signed)
Called pt no answer. LM for pt per DPR. RX sent.

## 2021-08-26 NOTE — Telephone Encounter (Signed)
-----   Message from Billey Co, MD sent at 08/26/2021  8:18 AM EDT ----- Please start nitrofurantoin 100 mg twice daily x5 days to sterilize urine prior to upcoming ureteroscopy  Nickolas Madrid, MD 08/26/2021

## 2021-08-26 NOTE — Patient Instructions (Addendum)
Your procedure is scheduled on: 08/29/21 - Friday  Report to the Registration Desk on the 1st floor of the [facility]. To find out your arrival time, please call [phone] between 1PM - 3PM on: 08/28/21 - Thursday If your arrival time is 6:00 am, do not arrive prior to that time as the [place] entrance doors do not open until 6:00 am.  REMEMBER: Instructions that are not followed completely may result in serious medical risk, up to and including death; or upon the discretion of your surgeon and anesthesiologist your surgery may need to be rescheduled.  Do not eat food or drink any fluids after midnight the night before surgery.  No gum chewing, lozengers or hard candies.  TAKE ONLY THESE MEDICATIONS THE MORNING OF SURGERY WITH A SIP OF WATER:  - SYNTHROID 137 MCG tablet - dapsone 25 MG tablet - tamsulosin (FLOMAX) 0.4 MG CAPS capsule  Use VENTOLIN HFA 108 (90 BASE) MCG/ACT inhaler on the day of surgery and bring to the hospital.  One week prior to surgery: Stop Anti-inflammatories (NSAIDS) such as Advil, Aleve, Ibuprofen, Motrin, Naproxen, Naprosyn and Aspirin based products such as Excedrin, Goodys Powder, BC Powder.  Stop ANY OVER THE COUNTER supplements until after surgery.  You may  take Tylenol if needed for pain up until the day of surgery.  No Alcohol for 24 hours before or after surgery.  No Smoking including e-cigarettes for 24 hours prior to surgery.  No chewable tobacco products for at least 6 hours prior to surgery.  No nicotine patches on the day of surgery.  Do not use any "recreational" drugs for at least a week prior to your surgery.  Please be advised that the combination of cocaine and anesthesia may have negative outcomes, up to and including death. If you test positive for cocaine, your surgery will be cancelled.  On the morning of surgery brush your teeth with toothpaste and water, you may rinse your mouth with mouthwash if you wish. Do not  swallow any toothpaste or mouthwash.  Do not wear jewelry, make-up, hairpins, clips or nail polish.  Do not wear lotions, powders, or perfumes.   Do not shave body from the neck down 48 hours prior to surgery just in case you cut yourself which could leave a site for infection.  Also, freshly shaved skin may become irritated if using the CHG soap.  Contact lenses, hearing aids and dentures may not be worn into surgery.  Do not bring valuables to the hospital. [facility] is not responsible for any missing/lost belongings or valuables.   Notify your doctor if there is any change in your medical condition (cold, fever, infection).  Wear comfortable clothing (specific to your surgery type) to the hospital.  After surgery, you can help prevent lung complications by doing breathing exercises.  Take deep breaths and cough every 1-2 hours. Your doctor may order a device called an Incentive Spirometer to help you take deep breaths. When coughing or sneezing, hold a pillow firmly against your incision with both hands. This is called "splinting." Doing this helps protect your incision. It also decreases belly discomfort.  If you are being admitted to the hospital overnight, leave your suitcase in the car. After surgery it may be brought to your room.  If you are being discharged the day of surgery, you will not be allowed to drive home. You will need a responsible adult (18 years or older) to drive you home and stay with you that  night.   If you are taking public transportation, you will need to have a responsible adult (18 years or older) with you. Please confirm with your physician that it is acceptable to use public transportation.   Please call the [place] Dept. at [phone] if you have any questions about these instructions.  Surgery Visitation Policy:  Patients undergoing a surgery or procedure may have two family members or support persons with them as long as the  person is not COVID-19 positive or experiencing its symptoms.   Inpatient Visitation:    Visiting hours are 7 a.m. to 8 p.m. Up to four visitors are allowed at one time in a patient room, including children. The visitors may rotate out with other people during the day. One designated support person (adult) may remain overnight.

## 2021-08-28 ENCOUNTER — Encounter
Admission: RE | Admit: 2021-08-28 | Discharge: 2021-08-28 | Disposition: A | Discharge status: Home / Self Care | Payer: Medicare Other | Source: Ambulatory Visit | Attending: Urology | Admitting: Urology

## 2021-08-28 ENCOUNTER — Encounter: Payer: Self-pay | Admitting: Urgent Care

## 2021-08-28 ENCOUNTER — Ambulatory Visit
Admission: RE | Admit: 2021-08-28 | Discharge: 2021-08-28 | Disposition: A | Discharge status: Home / Self Care | Payer: Medicare Other | Source: Ambulatory Visit | Attending: General Surgery | Admitting: General Surgery

## 2021-08-28 DIAGNOSIS — Z01812 Encounter for preprocedural laboratory examination: Secondary | ICD-10-CM | POA: Diagnosis present

## 2021-08-28 DIAGNOSIS — Z1231 Encounter for screening mammogram for malignant neoplasm of breast: Secondary | ICD-10-CM | POA: Diagnosis not present

## 2021-08-29 ENCOUNTER — Ambulatory Visit: Payer: Medicare Other | Admitting: Anesthesiology

## 2021-08-29 ENCOUNTER — Encounter
Admission: RE | Disposition: A | Discharge status: Home / Self Care | Payer: Self-pay | Source: Home / Self Care | Attending: Urology

## 2021-08-29 ENCOUNTER — Ambulatory Visit
Admission: RE | Admit: 2021-08-29 | Discharge: 2021-08-29 | Disposition: A | Discharge status: Home / Self Care | Payer: Medicare Other | Attending: Urology | Admitting: Urology

## 2021-08-29 ENCOUNTER — Ambulatory Visit: Payer: Medicare Other

## 2021-08-29 ENCOUNTER — Other Ambulatory Visit: Payer: Self-pay

## 2021-08-29 ENCOUNTER — Encounter: Payer: Self-pay | Admitting: Urology

## 2021-08-29 DIAGNOSIS — N200 Calculus of kidney: Secondary | ICD-10-CM | POA: Insufficient documentation

## 2021-08-29 DIAGNOSIS — J449 Chronic obstructive pulmonary disease, unspecified: Secondary | ICD-10-CM | POA: Diagnosis not present

## 2021-08-29 DIAGNOSIS — F32A Depression, unspecified: Secondary | ICD-10-CM | POA: Diagnosis not present

## 2021-08-29 DIAGNOSIS — F419 Anxiety disorder, unspecified: Secondary | ICD-10-CM | POA: Insufficient documentation

## 2021-08-29 DIAGNOSIS — M1990 Unspecified osteoarthritis, unspecified site: Secondary | ICD-10-CM | POA: Diagnosis not present

## 2021-08-29 DIAGNOSIS — K219 Gastro-esophageal reflux disease without esophagitis: Secondary | ICD-10-CM | POA: Diagnosis not present

## 2021-08-29 DIAGNOSIS — E039 Hypothyroidism, unspecified: Secondary | ICD-10-CM | POA: Insufficient documentation

## 2021-08-29 HISTORY — PX: CYSTOSCOPY/URETEROSCOPY/HOLMIUM LASER/STENT PLACEMENT: SHX6546

## 2021-08-29 SURGERY — CYSTOSCOPY/URETEROSCOPY/HOLMIUM LASER/STENT PLACEMENT
Anesthesia: General | Site: Ureter | Laterality: Left

## 2021-08-29 MED ORDER — CEFAZOLIN SODIUM-DEXTROSE 2-4 GM/100ML-% IV SOLN
2.0000 g | INTRAVENOUS | Status: AC
  Administered 2021-08-29: 2 g via INTRAVENOUS

## 2021-08-29 MED ORDER — SUGAMMADEX SODIUM 200 MG/2ML IV SOLN
INTRAVENOUS | Status: DC | PRN
  Administered 2021-08-29: 200 mg via INTRAVENOUS

## 2021-08-29 MED ORDER — ONDANSETRON HCL 4 MG/2ML IJ SOLN: 4.0000 mg | Freq: Once | INTRAMUSCULAR | Status: DC | PRN

## 2021-08-29 MED ORDER — FENTANYL CITRATE (PF) 100 MCG/2ML IJ SOLN
INTRAMUSCULAR | Status: DC | PRN
  Administered 2021-08-29: 50 ug via INTRAVENOUS

## 2021-08-29 MED ORDER — FENTANYL CITRATE (PF) 100 MCG/2ML IJ SOLN
INTRAMUSCULAR | Status: AC
  Filled 2021-08-29: qty 2

## 2021-08-29 MED ORDER — LIDOCAINE HCL (CARDIAC) PF 100 MG/5ML IV SOSY
PREFILLED_SYRINGE | INTRAVENOUS | Status: DC | PRN
  Administered 2021-08-29: 100 mg via INTRAVENOUS

## 2021-08-29 MED ORDER — KETOROLAC TROMETHAMINE 30 MG/ML IJ SOLN
INTRAMUSCULAR | Status: DC | PRN
  Administered 2021-08-29: 30 mg via INTRAVENOUS

## 2021-08-29 MED ORDER — DEXAMETHASONE SODIUM PHOSPHATE 10 MG/ML IJ SOLN
INTRAMUSCULAR | Status: AC
Start: 2021-08-29 — End: ?
  Filled 2021-08-29: qty 1

## 2021-08-29 MED ORDER — LACTATED RINGERS IV SOLN: INTRAVENOUS | Status: DC

## 2021-08-29 MED ORDER — ROCURONIUM BROMIDE 100 MG/10ML IV SOLN
INTRAVENOUS | Status: DC | PRN
  Administered 2021-08-29: 50 mg via INTRAVENOUS
  Administered 2021-08-29: 20 mg via INTRAVENOUS

## 2021-08-29 MED ORDER — SODIUM CHLORIDE 0.9 % IR SOLN
Status: DC | PRN
  Administered 2021-08-29: 3000 mL via INTRAVESICAL

## 2021-08-29 MED ORDER — PROPOFOL 10 MG/ML IV BOLUS
INTRAVENOUS | Status: DC | PRN
  Administered 2021-08-29: 150 mg via INTRAVENOUS

## 2021-08-29 MED ORDER — LIDOCAINE HCL (PF) 2 % IJ SOLN
INTRAMUSCULAR | Status: AC
  Filled 2021-08-29: qty 5

## 2021-08-29 MED ORDER — CHLORHEXIDINE GLUCONATE 0.12 % MT SOLN: 15.0000 mL | Freq: Once | OROMUCOSAL | Status: AC

## 2021-08-29 MED ORDER — TRAMADOL HCL 50 MG PO TABS: 50.0000 mg | ORAL_TABLET | Freq: Four times a day (QID) | ORAL | 0 refills | Status: AC | PRN

## 2021-08-29 MED ORDER — MIDAZOLAM HCL 2 MG/2ML IJ SOLN
INTRAMUSCULAR | Status: DC | PRN
  Administered 2021-08-29: 2 mg via INTRAVENOUS

## 2021-08-29 MED ORDER — DEXAMETHASONE SODIUM PHOSPHATE 10 MG/ML IJ SOLN
INTRAMUSCULAR | Status: DC | PRN
  Administered 2021-08-29: 10 mg via INTRAVENOUS

## 2021-08-29 MED ORDER — CHLORHEXIDINE GLUCONATE 0.12 % MT SOLN
OROMUCOSAL | Status: AC
  Administered 2021-08-29: 15 mL via OROMUCOSAL
  Filled 2021-08-29: qty 15

## 2021-08-29 MED ORDER — ROCURONIUM BROMIDE 10 MG/ML (PF) SYRINGE
PREFILLED_SYRINGE | INTRAVENOUS | Status: AC
Start: 2021-08-29 — End: ?
  Filled 2021-08-29: qty 10

## 2021-08-29 MED ORDER — MIDAZOLAM HCL 2 MG/2ML IJ SOLN
INTRAMUSCULAR | Status: AC
  Filled 2021-08-29: qty 2

## 2021-08-29 MED ORDER — CEFAZOLIN SODIUM-DEXTROSE 2-4 GM/100ML-% IV SOLN
INTRAVENOUS | Status: AC
  Filled 2021-08-29: qty 100

## 2021-08-29 MED ORDER — FENTANYL CITRATE (PF) 100 MCG/2ML IJ SOLN: 25.0000 ug | INTRAMUSCULAR | Status: DC | PRN

## 2021-08-29 MED ORDER — KETOROLAC TROMETHAMINE 10 MG PO TABS: 10.0000 mg | ORAL_TABLET | Freq: Four times a day (QID) | ORAL | 0 refills | Status: DC | PRN

## 2021-08-29 MED ORDER — KETOROLAC TROMETHAMINE 30 MG/ML IJ SOLN
INTRAMUSCULAR | Status: AC
  Filled 2021-08-29: qty 1

## 2021-08-29 MED ORDER — ONDANSETRON HCL 4 MG/2ML IJ SOLN
INTRAMUSCULAR | Status: AC
  Filled 2021-08-29: qty 2

## 2021-08-29 MED ORDER — ORAL CARE MOUTH RINSE: 15.0000 mL | Freq: Once | OROMUCOSAL | Status: AC

## 2021-08-29 MED ORDER — NITROFURANTOIN MACROCRYSTAL 100 MG PO CAPS: 100.0000 mg | ORAL_CAPSULE | Freq: Every day | ORAL | 0 refills | Status: AC

## 2021-08-29 MED ORDER — ONDANSETRON HCL 4 MG/2ML IJ SOLN
INTRAMUSCULAR | Status: DC | PRN
  Administered 2021-08-29: 4 mg via INTRAVENOUS

## 2021-08-29 MED ORDER — IOHEXOL 180 MG/ML  SOLN
INTRAMUSCULAR | Status: DC | PRN
  Administered 2021-08-29: 5 mL

## 2021-08-29 SURGICAL SUPPLY — 31 items
ADH LQ OCL WTPRF AMP STRL LF (MISCELLANEOUS)
ADHESIVE MASTISOL STRL (MISCELLANEOUS) IMPLANT
BAG DRAIN CYSTO-URO LG1000N (MISCELLANEOUS) ×2 IMPLANT
BAG PRESSURE INF REUSE 3000 (BAG) ×2 IMPLANT
BRUSH SCRUB EZ 1% IODOPHOR (MISCELLANEOUS) ×2 IMPLANT
CATH URET FLEX-TIP 2 LUMEN 10F (CATHETERS) ×1 IMPLANT
CATH URETL OPEN 5X70 (CATHETERS) ×1 IMPLANT
CNTNR SPEC 2.5X3XGRAD LEK (MISCELLANEOUS)
CONT SPEC 4OZ STER OR WHT (MISCELLANEOUS)
CONT SPEC 4OZ STRL OR WHT (MISCELLANEOUS)
CONTAINER SPEC 2.5X3XGRAD LEK (MISCELLANEOUS) IMPLANT
DRAPE UTILITY 15X26 TOWEL STRL (DRAPES) ×2 IMPLANT
DRSG TEGADERM 2-3/8X2-3/4 SM (GAUZE/BANDAGES/DRESSINGS) IMPLANT
FIBER LASER MOSES 200 DFL (Laser) ×1 IMPLANT
GLOVE SURG UNDER POLY LF SZ7.5 (GLOVE) ×2 IMPLANT
GOWN STRL REUS W/ TWL LRG LVL3 (GOWN DISPOSABLE) ×1 IMPLANT
GOWN STRL REUS W/ TWL XL LVL3 (GOWN DISPOSABLE) ×1 IMPLANT
GOWN STRL REUS W/TWL LRG LVL3 (GOWN DISPOSABLE) ×2
GOWN STRL REUS W/TWL XL LVL3 (GOWN DISPOSABLE) ×2
GUIDEWIRE STR DUAL SENSOR (WIRE) ×3 IMPLANT
IV NS IRRIG 3000ML ARTHROMATIC (IV SOLUTION) ×2 IMPLANT
KIT TURNOVER CYSTO (KITS) ×2 IMPLANT
PACK CYSTO AR (MISCELLANEOUS) ×2 IMPLANT
SET CYSTO W/LG BORE CLAMP LF (SET/KITS/TRAYS/PACK) ×2 IMPLANT
SHEATH NAVIGATOR HD 12/14X36 (SHEATH) ×1 IMPLANT
STENT URET 6FRX24 CONTOUR (STENTS) IMPLANT
STENT URET 6FRX26 CONTOUR (STENTS) ×1 IMPLANT
SURGILUBE 2OZ TUBE FLIPTOP (MISCELLANEOUS) ×2 IMPLANT
SYR 10ML LL (SYRINGE) ×2 IMPLANT
VALVE UROSEAL ADJ ENDO (VALVE) ×1 IMPLANT
WATER STERILE IRR 500ML POUR (IV SOLUTION) ×2 IMPLANT

## 2021-08-29 NOTE — Discharge Instructions (Signed)

## 2021-08-29 NOTE — Op Note (Signed)
Date of procedure: 08/29/21  Preoperative diagnosis:  Left renal stones  Postoperative diagnosis:  Same  Procedure: Left ureteroscopy, laser lithotripsy, left retrograde pyelogram with intraoperative interpretation, left ureteral stent placement  Surgeon: Nickolas Madrid, MD  Anesthesia: General  Complications: None  Intraoperative findings:  Normal cystoscopy, uncomplicated dusting of multiple left renal stones Uncomplicated stent placement  EBL: Minimal  Specimens: Stone sent for analysis  Drains: Left 6 French by 26 cm ureteral stent  Indication: Maureen Brown is a 71 y.o. patient with significant left renal stone burden who opted for ureteroscopy.  After reviewing the management options for treatment, they elected to proceed with the above surgical procedure(s). We have discussed the potential benefits and risks of the procedure, side effects of the proposed treatment, the likelihood of the patient achieving the goals of the procedure, and any potential problems that might occur during the procedure or recuperation. Informed consent has been obtained.  Description of procedure:  The patient was taken to the operating room and general anesthesia was induced. SCDs were placed for DVT prophylaxis. The patient was placed in the dorsal lithotomy position, prepped and draped in the usual sterile fashion, and preoperative antibiotics(Ancef) were administered. A preoperative time-out was performed.   A 21 French rigid cystoscope was used to intubate the urethra and thorough cystoscopy was performed.  The bladder was grossly normal, and the ureteral orifices orthotopic bilaterally.  A sensor wire passed easily into the left kidney under fluoroscopic vision.  On fluoroscopy I could clearly see a large midpole and lower pole stone measuring about 1 cm each.    A semirigid ureteroscope was advanced alongside the wire, there was no evidence of ureteral stones.  A dual-lumen ureteral  access catheter was then advanced over the wire up to the kidney and a second safety sensor wire was added.  A 12/14 French by 36 cm ureteral access sheath advanced easily over the wire under fluoroscopic vision up to the kidney.  The single channel digital flexible ureteroscope was then advanced through the sheath and thorough pyeloscopy was performed.  There was a large 1 cm midpole stone occupied almost an entire calyx, as well as a 1 cm lower pole stone, and some smaller spherical stones in the lower pole.  The 200 m laser fiber on settings of 0.5 J and 80 Hz was used to methodically dust all the stones.  There was excellent fragmentation of the stone dusted nicely.  Thorough pyeloscopy revealed no significant stone fragments at the conclusion of the case.  Stone dust was able to be sent for analysis.  No stone was visualized on fluoroscopy at the conclusion of the procedure.  A retrograde pyelogram was performed from the proximal ureter which showed no extravasation or filling defects.  The sheath was removed and careful pullback ureteroscopy showed no evidence of ureteral injury or residual fragments.  A 6 French by 26 cm ureteral stent was placed fluoroscopically with an excellent curl in the upper pole, as well as in the bladder.  The bladder was drained and this concluded the procedure.  Disposition: Stable to PACU  Plan: Follow-up for stent removal in 5 to 10 days Follow-up stone analysis  Nickolas Madrid, MD

## 2021-08-29 NOTE — Anesthesia Preprocedure Evaluation (Addendum)
Anesthesia Evaluation  Patient identified by MRN, date of birth, ID band Patient awake    Reviewed: Allergy & Precautions, H&P , NPO status , Patient's Chart, lab work & pertinent test results, reviewed documented beta blocker date and time   History of Anesthesia Complications (+) PONV and history of anesthetic complications  Airway Mallampati: II  TM Distance: <3 FB Neck ROM: full    Dental  (+) Missing, Chipped   Pulmonary neg shortness of breath, asthma , neg sleep apnea, pneumonia, neg COPD, neg recent URI,    Pulmonary exam normal        Cardiovascular Exercise Tolerance: Good negative cardio ROS Normal cardiovascular exam     Neuro/Psych PSYCHIATRIC DISORDERS Anxiety Depression negative neurological ROS     GI/Hepatic Neg liver ROS, GERD  ,  Endo/Other  neg diabetesHypothyroidism   Renal/GU Renal disease (kidney stones)  negative genitourinary   Musculoskeletal  (+) Arthritis ,   Abdominal   Peds  Hematology  (+) Blood dyscrasia, anemia ,   Anesthesia Other Findings Past Medical History:   Asthma                                          1989         Cystitis, unspecified                                        Varicose veins of lower extremities with other*              GERD (gastroesophageal reflux disease)                       Kidney stone                                    2014         Anxiety                                                      Vitamin B 12 deficiency                                      Chest pain                                                   Grief reaction                                  2008         Herpes labialis  Anemia                                                       Hypothyroidism                                               Insomnia                                                     Kidney stone                                                  Leg edema                                                    Leukopenia                                                   Cancer (HCC)                                    1989           Comment:thyroid   Arthritis                                                    Pleural effusion                                             Postsurgical hypoparathyroidism (HCC)                        Stress incontinence                                          Reproductive/Obstetrics negative OB ROS                            Anesthesia Physical  Anesthesia Plan  ASA: 3  Anesthesia Plan: General   Post-op Pain Management:    Induction: Intravenous  PONV Risk Score and Plan: Ondansetron, Dexamethasone, Midazolam and Treatment may vary due to age or medical condition  Airway Management Planned: Oral ETT  Additional Equipment:   Intra-op Plan:   Post-operative Plan: Extubation in OR  Informed Consent: I have reviewed the patients History and Physical, chart, labs and discussed the procedure including the risks, benefits and alternatives for the proposed anesthesia with the patient or authorized representative who has indicated his/her understanding and acceptance.     Dental Advisory Given  Plan Discussed with: Anesthesiologist, CRNA and Surgeon  Anesthesia Plan Comments: (Patient consented for risks of anesthesia including but not limited to:  - adverse reactions to medications - risk of airway placement if required - damage to eyes, teeth, lips or other oral mucosa - nerve damage due to positioning  - sore throat or hoarseness - Damage to heart, brain, nerves, lungs, other parts of body or loss of life  Patient voiced understanding.)        Anesthesia Quick Evaluation
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
## 2021-08-29 NOTE — Anesthesia Postprocedure Evaluation (Signed)
Anesthesia Post Note  Patient: BULA CAVALIERI  Procedure(s) Performed: CYSTOSCOPY/URETEROSCOPY/HOLMIUM LASER/STENT PLACEMENT/LEFT RETROGRADE PYELOGRAM (Left: Ureter)  Patient location during evaluation: PACU Anesthesia Type: General Level of consciousness: awake and oriented Pain management: satisfactory to patient Vital Signs Assessment: post-procedure vital signs reviewed and stable Respiratory status: respiratory function stable and nonlabored ventilation Cardiovascular status: stable Anesthetic complications: no   No notable events documented.   Last Vitals:  Vitals:   08/29/21 0746 08/29/21 1036  BP: (!) 146/83 121/69  Pulse: 67 70  Resp: 18 12  Temp: 36.4 C (!) 36.3 C  SpO2: 100% 98%    Last Pain:  Vitals:   08/29/21 1036  TempSrc:   PainSc: 0-No pain                 VAN STAVEREN,Lamonta [place]

## 2021-08-29 NOTE — Transfer of Care (Signed)
Immediate Anesthesia Transfer of Care Note  Patient: Maureen Brown  Procedure(s) Performed: CYSTOSCOPY/URETEROSCOPY/HOLMIUM LASER/STENT PLACEMENT/LEFT RETROGRADE PYELOGRAM (Left: Ureter)  Patient Location: PACU  Anesthesia Type:General  Level of Consciousness: awake, alert  and oriented  Airway & Oxygen Therapy: Patient Spontanous Breathing and Patient connected to face mask oxygen  Post-op Assessment: Report given to RN and Post -op Vital signs reviewed and stable  Post vital signs: Reviewed and stable  Last Vitals:  Vitals Value Taken Time  BP 121/69   Temp    Pulse 70 08/29/21 1037  Resp 14 08/29/21 1037  SpO2 98 % 08/29/21 1037  Vitals shown include unvalidated device data.  Last Pain:  Vitals:   08/29/21 0746  TempSrc: Oral  PainSc: 2       Patients Stated Pain Goal: 0 (62/13/08 6578)  Complications: No notable events documented.

## 2021-08-29 NOTE — Anesthesia Procedure Notes (Signed)
Procedure Name: Intubation Date/Time: 08/29/2021 9:39 AM  Performed by: Aline Brochure, CRNAPre-anesthesia Checklist: Patient identified, Patient being monitored, Timeout performed, Emergency Drugs available and Suction available Patient Re-evaluated:Patient Re-evaluated prior to induction Oxygen Delivery Method: Circle system utilized Preoxygenation: Pre-oxygenation with 100% oxygen Induction Type: IV induction Ventilation: Mask ventilation without difficulty Laryngoscope Size: 3 and McGraph Grade View: Grade I Tube type: Oral Tube size: 7.0 mm Number of attempts: 1 Airway Equipment and Method: Stylet Placement Confirmation: ETT inserted through vocal cords under direct vision, positive ETCO2 and breath sounds checked- equal and bilateral Secured at: 22 cm Tube secured with: Tape Dental Injury: Teeth and Oropharynx as per pre-operative assessment

## 2021-08-30 ENCOUNTER — Encounter: Payer: Self-pay | Admitting: Urology

## 2021-09-01 ENCOUNTER — Other Ambulatory Visit: Payer: Self-pay | Admitting: General Surgery

## 2021-09-01 DIAGNOSIS — R928 Other abnormal and inconclusive findings on diagnostic imaging of breast: Secondary | ICD-10-CM

## 2021-09-01 DIAGNOSIS — N63 Unspecified lump in unspecified breast: Secondary | ICD-10-CM

## 2021-09-03 ENCOUNTER — Ambulatory Visit (INDEPENDENT_AMBULATORY_CARE_PROVIDER_SITE_OTHER): Payer: Medicare Other | Admitting: Urology

## 2021-09-03 VITALS — BP 134/79

## 2021-09-03 DIAGNOSIS — N200 Calculus of kidney: Secondary | ICD-10-CM

## 2021-09-03 DIAGNOSIS — Z466 Encounter for fitting and adjustment of urinary device: Secondary | ICD-10-CM | POA: Diagnosis not present

## 2021-09-03 LAB — MICROSCOPIC EXAMINATION
RBC, Urine: >30 /hpf — AB (ref 0–2)
WBC, UA: >30 /hpf — AB (ref 0–5)

## 2021-09-03 LAB — URINALYSIS, COMPLETE
Bilirubin, UA: NEGATIVE
Glucose, UA: NEGATIVE
Ketones, UA: NEGATIVE
Nitrite, UA: NEGATIVE
Specific Gravity, UA: 1.015 (ref 1.005–1.030)
Urobilinogen, Ur: 0.2 mg/dL (ref 0.2–1.0)
pH, UA: 6.5 (ref 5.0–7.5)

## 2021-09-03 LAB — STONE ANALYSIS
Brushite (CaHPO4 2H2O): 80 %
Calcium Oxalate Monohydrate: 10 %
Calcium Phosphate (Hydroxyl): 10 %
Weight Calculi: 21 mg

## 2021-09-03 NOTE — Progress Notes (Signed)
Cystoscopy Procedure Note:  Indication: Stent removal s/p 08/29/2021 left ureteroscopy and laser lithotripsy for significant left renal stone burden  Remains on nitrofurantoin prophylaxis, denies fever or UTI symptoms  After informed consent and discussion of the procedure and its risks, Maureen Brown was positioned and prepped in the standard fashion. Cystoscopy was performed with a flexible cystoscope. The stent was grasped with flexible graspers and removed in its entirety. The patient tolerated the procedure well.  Findings: Uncomplicated stent removal  Assessment and Plan: Call with stone analysis results RTC 6 months KUB  Billey Co, MD 09/03/2021

## 2021-09-23 ENCOUNTER — Ambulatory Visit
Admission: RE | Admit: 2021-09-23 | Discharge: 2021-09-23 | Disposition: A | Discharge status: Home / Self Care | Payer: Medicare Other | Source: Ambulatory Visit | Attending: General Surgery | Admitting: General Surgery

## 2021-09-23 DIAGNOSIS — N63 Unspecified lump in unspecified breast: Secondary | ICD-10-CM | POA: Insufficient documentation

## 2021-09-23 DIAGNOSIS — R928 Other abnormal and inconclusive findings on diagnostic imaging of breast: Secondary | ICD-10-CM | POA: Insufficient documentation

## 2021-12-16 ENCOUNTER — Encounter: Payer: Self-pay | Admitting: Urology

## 2022-03-02 ENCOUNTER — Ambulatory Visit
Admission: RE | Admit: 2022-03-02 | Discharge: 2022-03-02 | Disposition: A | Discharge status: Home / Self Care | Payer: Medicare Other | Source: Ambulatory Visit | Attending: Urology | Admitting: Urology

## 2022-03-02 ENCOUNTER — Ambulatory Visit
Admission: RE | Admit: 2022-03-02 | Discharge: 2022-03-02 | Disposition: A | Discharge status: Home / Self Care | Payer: Medicare Other | Attending: Urology | Admitting: Urology

## 2022-03-02 DIAGNOSIS — N200 Calculus of kidney: Secondary | ICD-10-CM | POA: Insufficient documentation

## 2022-03-03 ENCOUNTER — Ambulatory Visit (INDEPENDENT_AMBULATORY_CARE_PROVIDER_SITE_OTHER): Payer: Medicare Other | Admitting: Urology

## 2022-03-03 ENCOUNTER — Encounter: Payer: Self-pay | Admitting: Urology

## 2022-03-03 VITALS — BP 144/79 | Ht 70.0 in | Wt 235.0 lb

## 2022-03-03 DIAGNOSIS — N200 Calculus of kidney: Secondary | ICD-10-CM

## 2022-03-03 NOTE — Progress Notes (Signed)
   03/03/2022 1:17 PM   Maureen Brown 07-31-1950 [id number]  Reason for visit: Follow up recurrent nephrolithiasis  HPI: 71 year old female with extensive history of stone disease who was previously has been followed by Dr. Jacqlyn Larsen and Dr. Bernardo Heater.  She had been on potassium citrate long-term for stone prevention.  That was discontinued previously when she was found of calcium phosphate stones, but she actually started to form more stones when she stopped the potassium citrate and that was resumed in April 2022.  PTH has been low in the past, and indapamide previously has not improved her hypercalciuria.  She has been off the potassium citrate for at least a few months, as this caused worsening GI problems/upset stomach.  Most recent 24-hour urine notable for low urine volume of 2 L, elevated calcium of 388, elevated oxalate of 46, low urine citrate of 193, normal urine sodium of 81.  Potassium citrate was restarted after that 24-hour urine in the setting of the very low urine citrate, but she is no longer taking the potassium citrate secondary to GI distress.  Most recent ureteroscopy was June 2023 for significant left-sided renal stone burden.  I personally viewed and interpreted the most recent KUB from 03/02/2022 that shows some faint possible calcifications over the left lower pole that may represent some stone dust/fragments.  She denies any problems over the last 6 months and feels well.  We again reviewed stone prevention strategies including increasing fluid intake, low-salt diet, citrate in the diet, and I also recommended adding litholyte for stone prevention with her history of very low urine citrate.  We discussed the need to monitor the pH to prevent over alkalinization and increasing formation of calcium phosphate stones.  Stone prevention strategies discussed, start litholyte BID RTC 9 months KUB prior     Billey Co, MD  [facility] [address], [place], [place] [address] [phone]

## 2022-03-03 NOTE — Patient Instructions (Signed)
Continue to drink plenty of fluids, follow a low-salt diet, avoid high oxalate foods like spinach and nuts.  You can order litholyte from [email or web address], this can help prevent kidney stones.  It has no taste and can be added to any liquid.  Would recommend using twice daily.

## 2022-03-11 ENCOUNTER — Ambulatory Visit: Payer: Medicare Other | Admitting: Urology

## 2022-07-07 ENCOUNTER — Ambulatory Visit (INDEPENDENT_AMBULATORY_CARE_PROVIDER_SITE_OTHER): Payer: Medicare Other

## 2022-07-07 ENCOUNTER — Ambulatory Visit
Admission: EM | Admit: 2022-07-07 | Discharge: 2022-07-07 | Disposition: A | Discharge status: Home / Self Care | Payer: Medicare Other | Attending: Emergency Medicine | Admitting: Emergency Medicine

## 2022-07-07 DIAGNOSIS — J4521 Mild intermittent asthma with (acute) exacerbation: Secondary | ICD-10-CM | POA: Diagnosis not present

## 2022-07-07 MED ORDER — PREDNISONE 20 MG PO TABS: 40.0000 mg | ORAL_TABLET | Freq: Every day | ORAL | 0 refills | Status: DC

## 2022-07-07 MED ORDER — AZITHROMYCIN 250 MG PO TABS: 250.0000 mg | ORAL_TABLET | Freq: Every day | ORAL | 0 refills | Status: DC

## 2022-07-07 MED ORDER — HYDROCOD POLI-CHLORPHE POLI ER 10-8 MG/5ML PO SUER: 5.0000 mL | Freq: Two times a day (BID) | ORAL | 0 refills | Status: DC | PRN
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
## 2022-07-07 NOTE — Discharge Instructions (Addendum)
Today you are being treated for inflammation to your upper airways    Begin use of prednisone every morning with food for 5 days to help reduce inflammation   You may use tussinex twice daily as needed for coughing, be mindful it may make you feel drowsy    You can take Tylenol and/or Ibuprofen as needed for fever reduction and pain relief.   For cough: honey 1/2 to 1 teaspoon (you can dilute the honey in water or another fluid).  You can also use guaifenesin and dextromethorphan for cough. You can use a humidifier for chest congestion and cough.  If you don't have a humidifier, you can sit in the bathroom with the hot shower running.      For sore throat: try warm salt water gargles, cepacol lozenges, throat spray, warm tea or water with lemon/honey, popsicles or ice, or OTC cold relief medicine for throat discomfort.   For congestion: take a daily anti-histamine like Zyrtec, Claritin, and a oral decongestant, such as pseudoephedrine.  You can also use Flonase 1-2 sprays in each nostril daily.   It is important to stay hydrated: drink plenty of fluids (water, gatorade/powerade/pedialyte, juices, or teas) to keep your throat moisturized and help further relieve irritation/discomfort.

## 2022-07-07 NOTE — ED Triage Notes (Signed)
Pt presents with c/o cough and burning in chest when coughing. States she has noticed blood when blowing her nose.   Home interventions: mucinex, cough syrup and homey cough drops.

## 2022-08-26 ENCOUNTER — Ambulatory Visit: Payer: Medicare Other | Admitting: Urology

## 2022-08-26 ENCOUNTER — Other Ambulatory Visit: Payer: Self-pay | Admitting: General Surgery

## 2022-08-26 DIAGNOSIS — Z1231 Encounter for screening mammogram for malignant neoplasm of breast: Secondary | ICD-10-CM

## 2022-09-25 ENCOUNTER — Ambulatory Visit
Admission: RE | Admit: 2022-09-25 | Discharge: 2022-09-25 | Disposition: A | Discharge status: Home / Self Care | Payer: Medicare Other | Source: Ambulatory Visit | Attending: General Surgery | Admitting: General Surgery

## 2022-09-25 DIAGNOSIS — Z1231 Encounter for screening mammogram for malignant neoplasm of breast: Secondary | ICD-10-CM | POA: Insufficient documentation

## 2022-11-10 ENCOUNTER — Ambulatory Visit
Admission: RE | Admit: 2022-11-10 | Discharge: 2022-11-10 | Disposition: A | Discharge status: Home / Self Care | Payer: Medicare Other | Attending: Urology | Admitting: Urology

## 2022-11-10 ENCOUNTER — Other Ambulatory Visit: Payer: Self-pay

## 2022-11-10 ENCOUNTER — Encounter: Payer: Self-pay | Admitting: Urology

## 2022-11-10 ENCOUNTER — Ambulatory Visit
Admission: RE | Admit: 2022-11-10 | Discharge: 2022-11-10 | Disposition: A | Discharge status: Home / Self Care | Payer: Medicare Other | Source: Ambulatory Visit | Attending: Urology | Admitting: Urology

## 2022-11-10 ENCOUNTER — Other Ambulatory Visit
Admission: RE | Admit: 2022-11-10 | Discharge: 2022-11-10 | Disposition: A | Discharge status: Home / Self Care | Payer: Medicare Other | Source: Home / Self Care | Attending: Urology | Admitting: Urology

## 2022-11-10 ENCOUNTER — Ambulatory Visit: Payer: Medicare Other | Admitting: Urology

## 2022-11-10 VITALS — BP 161/73 | HR 63 | Ht 70.0 in | Wt 228.0 lb

## 2022-11-10 DIAGNOSIS — Z87440 Personal history of urinary (tract) infections: Secondary | ICD-10-CM

## 2022-11-10 DIAGNOSIS — N200 Calculus of kidney: Secondary | ICD-10-CM

## 2022-11-10 LAB — URINALYSIS, COMPLETE (UACMP) WITH MICROSCOPIC
Bilirubin Urine: NEGATIVE
Glucose, UA: NEGATIVE mg/dL
Ketones, ur: NEGATIVE mg/dL
Nitrite: NEGATIVE
Protein, ur: NEGATIVE mg/dL
Specific Gravity, Urine: 1.01 (ref 1.005–1.030)
pH: 7 (ref 5.0–8.0)

## 2022-11-10 MED ORDER — POTASSIUM CITRATE ER 15 MEQ (1620 MG) PO TBCR: 1.0000 | EXTENDED_RELEASE_TABLET | Freq: Two times a day (BID) | ORAL | 11 refills | Status: DC

## 2022-11-10 NOTE — Progress Notes (Signed)
   11/10/2022 11:09 AM   Maureen Brown 1950/11/23 [id number]  Reason for visit: Follow up recurrent nephrolithiasis  HPI: 72 year old female with extensive history of stone disease who was previously has been followed by Dr. Achilles Dunk and Dr. Lonna Cobb.  She had been on potassium citrate long-term for stone prevention.  That was discontinued previously when she was found to have calcium phosphate stones, but she actually started to form more stones when she stopped the potassium citrate and that was resumed in April 2022.  PTH has been low in the past, and indapamide previously has not improved her hypercalciuria.  She has intermittently stopped potassium citrate in the past when she felt like it was contributing to GI symptoms.  Most recent 24-hour urine notable for low urine volume of 2 L, elevated calcium of 388, elevated oxalate of 46, low urine citrate of 193, normal urine sodium of 81.  Potassium citrate was restarted after that 24-hour urine in the setting of the very low urine citrate, but she is no longer taking the potassium citrate secondary to GI distress.  Most recent ureteroscopy was June 2023 for significant left-sided renal stone burden.  I personally viewed and interpreted the most recent KUB from 11/10/2022 that shows relatively stable 1 cm left lower pole stone.  She denies any stone events since her last visit, denies any UTIs or gross hematuria.  Urine sample today with asymptomatic bacteriuria, urine pH 7, would not recommend antibiotics with her absence of urinary symptoms.   Stone prevention strategies discussed, continue potassium citrate(refilled) Consider repeat 24-hour urine next year RTC 1 year KUB prior   Sondra Come, MD  [facility] [address] [place], [place] [address] [phone]

## 2022-11-11 ENCOUNTER — Telehealth: Payer: Self-pay | Admitting: Urology

## 2022-11-11 DIAGNOSIS — B3731 Acute candidiasis of vulva and vagina: Secondary | ICD-10-CM

## 2022-11-11 DIAGNOSIS — Z87440 Personal history of urinary (tract) infections: Secondary | ICD-10-CM

## 2022-11-11 MED ORDER — FLUCONAZOLE 150 MG PO TABS
150.0000 mg | ORAL_TABLET | Freq: Once | ORAL | 0 refills | Status: AC
Start: 2022-11-11 — End: 2022-11-11

## 2022-11-11 MED ORDER — NITROFURANTOIN MONOHYD MACRO 100 MG PO CAPS
100.0000 mg | ORAL_CAPSULE | Freq: Two times a day (BID) | ORAL | 0 refills | Status: DC
Start: 2022-11-11 — End: 2023-02-10

## 2022-11-11 NOTE — Telephone Encounter (Signed)
Called pt she states that she is awoke this morning with vaginal discharge, vaginal itching and mild dysuria. RX sent in for nitrofurantoin 100mg  BID x7 days per Dr. Richardo Hanks. RX also sent in for diflucan for likely yeast infection. Pt voiced understanding.

## 2022-11-11 NOTE — Telephone Encounter (Signed)
Preliminary cx results are back, do you want to wait on final. Please advise.

## 2022-11-11 NOTE — Telephone Encounter (Signed)
Pt LMOM stating she saw Sninsky yesterday and wanted to know if we would call in something for her UTI.

## 2022-11-12 LAB — URINE CULTURE: Culture: >=100000 — AB

## 2022-11-26 ENCOUNTER — Ambulatory Visit: Payer: Medicare Other | Admitting: Urology

## 2023-02-10 ENCOUNTER — Ambulatory Visit: Payer: Medicare Other

## 2023-02-10 ENCOUNTER — Ambulatory Visit
Admission: EM | Admit: 2023-02-10 | Discharge: 2023-02-10 | Disposition: A | Discharge status: Home / Self Care | Payer: Medicare Other | Attending: Emergency Medicine | Admitting: Emergency Medicine

## 2023-02-10 DIAGNOSIS — J4541 Moderate persistent asthma with (acute) exacerbation: Secondary | ICD-10-CM | POA: Insufficient documentation

## 2023-02-10 DIAGNOSIS — Z20822 Contact with and (suspected) exposure to covid-19: Secondary | ICD-10-CM | POA: Diagnosis not present

## 2023-02-10 DIAGNOSIS — J0100 Acute maxillary sinusitis, unspecified: Secondary | ICD-10-CM | POA: Insufficient documentation

## 2023-02-10 DIAGNOSIS — B9789 Other viral agents as the cause of diseases classified elsewhere: Secondary | ICD-10-CM | POA: Insufficient documentation

## 2023-02-10 DIAGNOSIS — J988 Other specified respiratory disorders: Secondary | ICD-10-CM | POA: Insufficient documentation

## 2023-02-10 LAB — SARS CORONAVIRUS 2 BY RT PCR: SARS Coronavirus 2 by RT PCR: NEGATIVE

## 2023-02-10 MED ORDER — AEROCHAMBER MV MISC: 1 refills | Status: AC

## 2023-02-10 MED ORDER — PREDNISONE 20 MG PO TABS: 40.0000 mg | ORAL_TABLET | Freq: Every day | ORAL | 0 refills | Status: AC

## 2023-02-10 MED ORDER — IPRATROPIUM-ALBUTEROL 0.5-2.5 (3) MG/3ML IN SOLN
3.0000 mL | Freq: Once | RESPIRATORY_TRACT | Status: AC
  Administered 2023-02-10: 3 mL via RESPIRATORY_TRACT

## 2023-02-10 MED ORDER — FLUTICASONE PROPIONATE 50 MCG/ACT NA SUSP: 2.0000 | Freq: Every day | NASAL | 0 refills | Status: AC

## 2023-02-10 MED ORDER — ALBUTEROL SULFATE (2.5 MG/3ML) 0.083% IN NEBU
2.5000 mg | INHALATION_SOLUTION | Freq: Once | RESPIRATORY_TRACT | Status: AC
  Administered 2023-02-10: 2.5 mg via RESPIRATORY_TRACT

## 2023-02-10 MED ORDER — DOXYCYCLINE HYCLATE 100 MG PO CAPS: 100.0000 mg | ORAL_CAPSULE | Freq: Two times a day (BID) | ORAL | 0 refills | Status: AC

## 2023-02-10 MED ORDER — HYDROCOD POLI-CHLORPHE POLI ER 10-8 MG/5ML PO SUER: 5.0000 mL | Freq: Two times a day (BID) | ORAL | 0 refills | Status: AC | PRN
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
## 2023-02-10 NOTE — ED Triage Notes (Signed)
Pt c/o cough,hoarseness & congestion x5 days.

## 2023-02-10 NOTE — Discharge Instructions (Signed)
Your COVID PCR testing is negative.  I did not appreciate a pneumonia on your x-ray, but we will contact you if radiology sees anything abnormal on your x-ray.  If the spacer is too expensive, get an AeroChamber Z stat chamber from [employer].  It cost anywhere from $10-$15.  2 puffs from your albuterol inhaler using her spacer or an albuterol nebulizer every 4 hours for 2 days, then every 6 hours for 2 days, then as needed.  Finish the prednisone and doxycycline, even if you feel better.  Saline nasal irrigation with a [employer] sinus rinse and distilled water as often as you want, Flonase.

## 2023-03-01 ENCOUNTER — Ambulatory Visit
Admission: RE | Admit: 2023-03-01 | Discharge: 2023-03-01 | Disposition: A | Discharge status: Home / Self Care | Payer: Medicare Other | Source: Ambulatory Visit | Attending: Physician Assistant | Admitting: Physician Assistant

## 2023-03-01 ENCOUNTER — Other Ambulatory Visit: Payer: Self-pay | Admitting: Physician Assistant

## 2023-03-01 DIAGNOSIS — M7989 Other specified soft tissue disorders: Secondary | ICD-10-CM | POA: Diagnosis present

## 2023-03-08 ENCOUNTER — Telehealth (INDEPENDENT_AMBULATORY_CARE_PROVIDER_SITE_OTHER): Payer: Self-pay

## 2023-03-08 NOTE — Telephone Encounter (Signed)
She can come in with a reflux study of LLE.  She had a DVT study but that doesn't look at the function of veins.  She can be placed as schedule allows

## 2023-03-08 NOTE — Telephone Encounter (Signed)
Patient called stating her left leg vein is swollen again and it hurts to walk. She wants an appt to get her veins checked.   Please advise

## 2023-04-01 ENCOUNTER — Other Ambulatory Visit (INDEPENDENT_AMBULATORY_CARE_PROVIDER_SITE_OTHER): Payer: Self-pay | Admitting: Nurse Practitioner

## 2023-04-01 DIAGNOSIS — M7989 Other specified soft tissue disorders: Secondary | ICD-10-CM

## 2023-04-07 ENCOUNTER — Ambulatory Visit (INDEPENDENT_AMBULATORY_CARE_PROVIDER_SITE_OTHER): Payer: Medicare Other

## 2023-04-07 ENCOUNTER — Encounter (INDEPENDENT_AMBULATORY_CARE_PROVIDER_SITE_OTHER): Payer: Self-pay | Admitting: Nurse Practitioner

## 2023-04-07 ENCOUNTER — Ambulatory Visit (INDEPENDENT_AMBULATORY_CARE_PROVIDER_SITE_OTHER): Payer: Medicare Other | Admitting: Nurse Practitioner

## 2023-04-07 VITALS — BP 140/80 | HR 64 | Resp 18 | Ht 69.0 in | Wt 230.0 lb

## 2023-04-07 DIAGNOSIS — I890 Lymphedema, not elsewhere classified: Secondary | ICD-10-CM | POA: Diagnosis not present

## 2023-04-07 DIAGNOSIS — M7989 Other specified soft tissue disorders: Secondary | ICD-10-CM | POA: Diagnosis not present

## 2023-04-07 DIAGNOSIS — I83812 Varicose veins of left lower extremities with pain: Secondary | ICD-10-CM | POA: Diagnosis not present

## 2023-04-07 NOTE — Progress Notes (Signed)
Subjective:    Patient ID: Maureen Brown, female    DOB: 01/20/1951, 73 y.o.   MRN: [id number] Chief Complaint  Patient presents with   Follow-up    fu Left Leg Vein + Reflux of LLE    The patient returns today for evaluation of varicose veins in her left lower extremity.  She previously had been treated at a different office with an endovenous laser ablation.  Several years ago she also underwent foam sclerotherapy.  However recently she has begun to have pain again in the left lower extremity.  This pain is reminiscent of the pain that she had prior to her endovenous laser ablation.  The patient continues to have pain in the lower extremities with dependency. The pain is lessened with elevation. Graduated compression stockings, Class I (20-30 mmHg), have been worn but the stockings do not eliminate the leg pain. Over-the-counter analgesics do not improve the symptoms. The degree of discomfort continues to interfere with daily activities. The patient notes the pain in the legs is causing problems with daily exercise, at the workplace and even with household activities and maintenance such as standing in the kitchen preparing meals and doing dishes.   Venous ultrasound shows normal deep venous system, no evidence of acute or chronic DVT.  Superficial reflux is present in the left great saphenous vein from the saphenofemoral junction down to the proximal calf    Review of Systems  Cardiovascular:  Positive for leg swelling.  All other systems reviewed and are negative.      Objective:   Physical Exam Vitals reviewed.  HENT:     Head: Normocephalic.  Cardiovascular:     Rate and Rhythm: Normal rate.  Pulmonary:     Effort: Pulmonary effort is normal.  Skin:    General: Skin is warm and dry.  Neurological:     Mental Status: She is alert and oriented to person, place, and time.  Psychiatric:        Mood and Affect: Mood normal.        Behavior: Behavior normal.        Thought  Content: Thought content normal.        Judgment: Judgment normal.     BP (!) 140/80   Pulse 64   Resp 18   Ht 5\' 9"  (1.753 m)   Wt 230 lb (104.3 kg)   BMI 33.97 kg/m   Past Medical History:  Diagnosis Date   Anemia    Anginal pain (HCC)    Anxiety    Arthritis    Asthma 1989   WELL CONTROLLED   Cancer (HCC) 1989   thyroid   Chest pain    COPD (chronic obstructive pulmonary disease) (HCC)    Cystitis, unspecified    Depression    Dyspnea    RELATED TO ASTHMA   GERD (gastroesophageal reflux disease)    H/O   Grief reaction 2008   Herpes labialis    History of kidney stones    Hypothyroidism    Insomnia    Kidney stone 2014, 2018   Leg edema    Leukopenia    Motion sickness    Boats   Pleural effusion    Pneumonia    PONV (postoperative nausea and vomiting)    HYSTERECTOMY   Postsurgical hypoparathyroidism (HCC)    Stress incontinence    Thrombocytopenia (HCC)    Varicose veins of lower extremities with other complications    Vitamin B 12 deficiency  Social History   Socioeconomic History   Marital status: Married    Spouse name: Brett Canales   Number of children: Not on file   Years of education: Not on file   Highest education level: Not on file  Occupational History   Not on file  Tobacco Use   Smoking status: Never    Passive exposure: Never   Smokeless tobacco: Never  Vaping Use   Vaping status: Never Used  Substance and Sexual Activity   Alcohol use: Yes    Comment: occassional   Drug use: No   Sexual activity: Yes    Birth control/protection: Post-menopausal  Other Topics Concern   Not on file  Social History Narrative   Not on file   Social Drivers of Health   Financial Resource Strain: Low Risk  (10/15/2022)   Received from [facility]   Overall Financial Resource Strain (CARDIA)    Difficulty of Paying Living Expenses: Not hard at all  Food Insecurity: No Food Insecurity (10/15/2022)   Received from [facility]   Hunger Vital Sign    Worried About Running Out of Food in the Last Year: Never true    Ran Out of Food in the Last Year: Never true  Transportation Needs: No Transportation Needs (10/15/2022)   Received from [facility] - Transportation    In the past 12 months, has lack of transportation kept you from medical appointments or from getting medications?: No    Lack of Transportation (Non-Medical): No  Physical Activity: Not on file  Stress: Not on file  Social Connections: Not on file  Intimate Partner Violence: Not on file    Past Surgical History:  Procedure Laterality Date   ABDOMINAL HYSTERECTOMY  1983   ABLATION SAPHENOUS VEIN W/ RFA Left 09/2012   GSV   APPENDECTOMY     BREAST BIOPSY Right 1973   neg   BREAST BIOPSY Left 08/26/2017   Papilloma- X clip   BREAST CYST REMOVAL     BREAST EXCISIONAL BIOPSY Left 09/20/2017   papalloma   BREAST LUMPECTOMY WITH NEEDLE LOCALIZATION Left 09/20/2017   Procedure: BREAST LUMPECTOMY WITH NEEDLE LOCALIZATION;  Surgeon: Carolan Shiver, MD;  Location: [facility];  Service: General;  Laterality: Left;   CATARACT EXTRACTION W/PHACO Right 01/15/2021   Procedure: CATARACT EXTRACTION PHACO AND INTRAOCULAR LENS PLACEMENT (IOC) RIGHT 14.70 01:35.4;  Surgeon: Lockie Mola, MD;  Location: [facility];  Service: Ophthalmology;  Laterality: Right;   CATARACT EXTRACTION W/PHACO Left 01/29/2021   Procedure: CATARACT EXTRACTION PHACO AND INTRAOCULAR LENS PLACEMENT (IOC) LEFT 5.02 00:49.8;  Surgeon: Lockie Mola, MD;  Location: [facility];  Service: Ophthalmology;  Laterality: Left;   CHOLECYSTECTOMY  2002   COLONOSCOPY  2009   Dr. Mechele Collin   COLONOSCOPY N/A 02/27/2020   Procedure: COLONOSCOPY;  Surgeon: Regis Bill, MD;  Location: [facility];  Service: Endoscopy;  Laterality: N/A;   COLONOSCOPY WITH PROPOFOL N/A 09/30/2015   Procedure: COLONOSCOPY WITH  PROPOFOL;  Surgeon: Scot Jun, MD;  Location: [facility] END[facility];  Service: Endoscopy;  Laterality: N/A;   CYSTOSCOPY W/ RETROGRADES Bilateral 11/10/2019   Procedure: CYSTOSCOPY WITH RETROGRADE PYELOGRAM;  Surgeon: Sondra Come, MD;  Location: [facility];  Service: Urology;  Laterality: Bilateral;   CYSTOSCOPY/URETEROSCOPY/HOLMIUM LASER/STENT PLACEMENT Left 11/10/2019   Procedure: CYSTOSCOPY/URETEROSCOPY/HOLMIUM LASER/STENT PLACEMENT;  Surgeon: Sondra Come, MD;  Location: [facility];  Service: Urology;  Laterality: Left;   CYSTOSCOPY/URETEROSCOPY/HOLMIUM LASER/STENT PLACEMENT  Left 08/29/2021   Procedure: CYSTOSCOPY/URETEROSCOPY/HOLMIUM LASER/STENT PLACEMENT/LEFT RETROGRADE PYELOGRAM;  Surgeon: Sondra Come, MD;  Location: [facility];  Service: Urology;  Laterality: Left;   KIDNEY STONE SURGERY  2014   KIDNEY STONE SURGERY  06/27/13, 07-09-16   Dr Achilles Dunk   KNEE SURGERY Right 1989   THYROID SURGERY  1988   TONSILLECTOMY     TOTAL THYROIDECTOMY  2011   UPPER GI ENDOSCOPY  2013    Family History  Problem Relation Age of Onset   Breast cancer Maternal Grandmother    Breast cancer Maternal Aunt    Thyroid cancer Paternal Grandmother    Heart disease Mother    Heart disease Father     Allergies  Allergen Reactions   Codeine Nausea Only    dizziness   Hydrochlorothiazide     Unknown   Hydrocodone-Acetaminophen Nausea And Vomiting and Other (See Comments)    Loss of balance, causes falls   Sulfa Antibiotics Hives   Advair Diskus [Fluticasone-Salmeterol] Rash   Augmentin [Amoxicillin-Pot Clavulanate] Rash    Has patient had a PCN reaction causing immediate rash, facial/tongue/throat swelling, SOB or lightheadedness with hypotension: No Has patient had a PCN reaction causing severe rash involving mucus membranes or skin necrosis: No Has patient had a PCN reaction that required hospitalization: No Has patient had a PCN reaction occurring within the last 10 years: No If all of the  above answers are "NO", then may proceed with Cephalosporin use.    Cephalosporins Rash   Ciprofloxacin Rash   Demerol [Meperidine] Rash    Throat itching   Gluten Meal Nausea And Vomiting and Rash    Wheat, Barley and Rye   Keflex [Cephalexin] Rash   Morphine And Codeine Rash       Latest Ref Rng & Units 03/07/2021   10:50 AM 08/21/2019   10:38 AM 05/19/2019   11:27 AM  CBC  WBC 4.0 - 10.5 K/uL 5.7  3.1  3.2   Hemoglobin 12.0 - 15.0 g/dL 25.3  9.5    66.4  40.3   Hematocrit 36.0 - 46.0 % 34.2  32.8  34.5   Platelets 150 - 400 K/uL 138  127  144       CMP     Component Value Date/Time   NA 134 (L) 03/07/2021 1050   K 3.7 03/07/2021 1050   CL 103 03/07/2021 1050   CO2 26 03/07/2021 1050   GLUCOSE 142 (H) 03/07/2021 1050   BUN 22 03/07/2021 1050   CREATININE 1.54 (H) 03/07/2021 1050   CALCIUM 10.1 03/07/2021 1050   PROT 7.5 03/07/2021 1059   ALBUMIN 4.2 03/07/2021 1059   AST 34 03/07/2021 1059   ALT 23 03/07/2021 1059   ALKPHOS 40 03/07/2021 1059   BILITOT 1.6 (H) 03/07/2021 1059   GFRNONAA 37 (L) 03/07/2021 1050     No results found.     Assessment & Plan:   1. Varicose veins of leg with pain, left (Primary) Recommend  I have reviewed my previous  discussion with the patient regarding  varicose veins and why they cause symptoms. Patient will continue  wearing graduated compression stockings class 1 on a daily basis, beginning first thing in the morning and removing them in the evening.  The patient is CEAP [id number].  The patient has been wearing compression for more than 12 weeks with no or little benefit.  The patient has been exercising daily for more than 12 weeks. The patient has been elevating and taking  OTC pain medications for more than 12 weeks.  None of these have have eliminated the pain related to the varicose veins and venous reflux or the discomfort regarding venous congestion.    In addition, behavioral modification including elevation during the  day was again discussed and this will continue.  The patient has utilized over the counter pain medications and has been exercising.  However, at this time conservative therapy has not alleviated the patient's symptoms of leg pain and swelling  Recommend: laser ablation of the  left great saphenous veins to eliminate the symptoms of pain and swelling of the lower extremities caused by the severe superficial venous reflux disease.   2. Lymphedema The patient notes that there may have been some discussion about a lymphedema pump previously.  However at this time the pain is much more worrisome for the patient and so we will move forward with endovenous laser ablation and evaluate her ongoing leg swelling following that she will continue with conservative therapy as mentioned above.   Current Outpatient Medications on File Prior to Visit  Medication Sig Dispense Refill   ALPRAZolam (XANAX) 0.25 MG tablet Take 0.25 mg by mouth every 6 (six) hours as needed for anxiety.      calcitRIOL (ROCALTROL) 0.5 MCG capsule Take 1 mcg by mouth daily.     chlorpheniramine-HYDROcodone (TUSSIONEX) 10-8 MG/5ML Take 5 mLs by mouth every 12 (twelve) hours as needed for cough. 60 mL 0   Cholecalciferol 125 MCG (5000 UT) capsule Take 5,000 Units by mouth daily.      clobetasol ointment (TEMOVATE) 0.05 % Apply 1 application  topically daily as needed (Rash).     Cyanocobalamin (VITAMIN B 12 PO) Take 1,000 mcg by mouth daily.     dapsone 25 MG tablet Take 50 mg by mouth every morning.     diphenhydrAMINE (BENADRYL) 25 MG tablet Take 25 mg by mouth every 6 (six) hours as needed.     ferrous gluconate (FERGON) 240 (27 FE) MG tablet Take 240 mg by mouth daily as needed (low iron).     fluticasone (FLONASE) 50 MCG/ACT nasal spray Place 2 sprays into both nostrils daily. 16 g 0   folic acid (FOLVITE) 1 MG tablet Take 1 mg by mouth daily.      furosemide (LASIX) 20 MG tablet Take 20 mg by mouth daily as needed for edema or  fluid.     Potassium Citrate 15 MEQ (1620 MG) TBCR Take 1 tablet by mouth 2 (two) times daily. 120 tablet 11   pyridoxine (B-6) 100 MG tablet Take 100 mg by mouth daily.     Spacer/Aero-Holding Chambers (AEROCHAMBER MV) inhaler Use as instructed 1 each 1   SYNTHROID 137 MCG tablet Take 68.5-137 mcg by mouth See admin instructions. Take 137 mcg by mouth daily except on Saturday take 68.5 mcg     tamsulosin (FLOMAX) 0.4 MG CAPS capsule Take by mouth daily.     valACYclovir (VALTREX) 1000 MG tablet Take 1,000 mg by mouth daily as needed (cold sores).     Vitamin D, Ergocalciferol, 50000 units CAPS Take by mouth.     No current facility-administered medications on file prior to visit.    There are no Patient Instructions on file for this visit. No follow-ups on file.   Georgiana Spinner, NP

## 2023-07-19 ENCOUNTER — Telehealth (INDEPENDENT_AMBULATORY_CARE_PROVIDER_SITE_OTHER): Payer: Self-pay | Admitting: Vascular Surgery

## 2023-07-19 NOTE — Telephone Encounter (Signed)
 LVM for pt TCB and schedule appt for laser ablation.  left leg GSV laser. see JD. auth # [id number] exp: 4.15.25 - 8.13.25   1 week post left leg GSV laser   4 week post left leg GSV laser. see fb/jd

## 2023-07-29 ENCOUNTER — Telehealth (INDEPENDENT_AMBULATORY_CARE_PROVIDER_SITE_OTHER): Payer: Self-pay | Admitting: Vascular Surgery

## 2023-07-29 NOTE — Telephone Encounter (Signed)
 LVM for pt TCB and schedule appt for laser ablation.  left leg GSV laser. see JD. auth # [id number] exp: 4.15.25 - 8.13.25   1 week post left leg GSV laser   4 week post left leg GSV laser. see fb/jd

## 2023-08-02 ENCOUNTER — Other Ambulatory Visit (INDEPENDENT_AMBULATORY_CARE_PROVIDER_SITE_OTHER): Payer: Self-pay

## 2023-08-02 ENCOUNTER — Telehealth (INDEPENDENT_AMBULATORY_CARE_PROVIDER_SITE_OTHER): Payer: Self-pay | Admitting: Vascular Surgery

## 2023-08-02 MED ORDER — ALPRAZOLAM 0.5 MG PO TABS: ORAL_TABLET | ORAL | 0 refills | Status: AC

## 2023-08-02 NOTE — Telephone Encounter (Signed)
 Patient is scheduled for a left leg GSV laser ablation with Dr. Vonna Guardian on 7.25.25. Pt will need the standard RX protocol sent in to [facility] in [place]. Thank you.

## 2023-08-02 NOTE — Telephone Encounter (Signed)
 sent

## 2023-08-10 ENCOUNTER — Other Ambulatory Visit: Payer: Self-pay | Admitting: General Surgery

## 2023-08-10 DIAGNOSIS — Z1231 Encounter for screening mammogram for malignant neoplasm of breast: Secondary | ICD-10-CM

## 2023-08-21 ENCOUNTER — Ambulatory Visit
Admission: EM | Admit: 2023-08-21 | Discharge: 2023-08-21 | Disposition: A | Discharge status: Home / Self Care | Attending: Physician Assistant | Admitting: Physician Assistant

## 2023-08-21 ENCOUNTER — Encounter: Payer: Self-pay | Admitting: Emergency Medicine

## 2023-08-21 DIAGNOSIS — H1013 Acute atopic conjunctivitis, bilateral: Secondary | ICD-10-CM

## 2023-08-21 MED ORDER — NAPHAZOLINE-PHENIRAMINE 0.025-0.3 % OP SOLN: 1.0000 [drp] | Freq: Four times a day (QID) | OPHTHALMIC | 0 refills | Status: AC | PRN

## 2023-08-21 NOTE — Discharge Instructions (Addendum)
-  Start taking Zyrtec and Claritin daily and use prescription eye drops -May continue cool and/or warm compresses

## 2023-08-21 NOTE — ED Triage Notes (Signed)
 Pt c/o bilateral eye drainage, crusted, and itching. Started about 2 days ago. She has been using cool and warm compresses without relief. She also tried benadryl.
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
## 2023-09-28 ENCOUNTER — Ambulatory Visit
Admission: RE | Admit: 2023-09-28 | Discharge: 2023-09-28 | Disposition: A | Discharge status: Home / Self Care | Source: Ambulatory Visit | Attending: General Surgery | Admitting: General Surgery

## 2023-09-28 ENCOUNTER — Encounter

## 2023-09-28 DIAGNOSIS — Z1231 Encounter for screening mammogram for malignant neoplasm of breast: Secondary | ICD-10-CM | POA: Diagnosis present

## 2023-10-08 ENCOUNTER — Ambulatory Visit (INDEPENDENT_AMBULATORY_CARE_PROVIDER_SITE_OTHER): Admitting: Vascular Surgery

## 2023-10-08 ENCOUNTER — Encounter (INDEPENDENT_AMBULATORY_CARE_PROVIDER_SITE_OTHER): Payer: Self-pay | Admitting: Vascular Surgery

## 2023-10-08 VITALS — BP 145/78 | HR 66 | Resp 18 | Wt 229.4 lb

## 2023-10-08 DIAGNOSIS — I83812 Varicose veins of left lower extremities with pain: Secondary | ICD-10-CM | POA: Diagnosis not present
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
## 2023-10-12 ENCOUNTER — Other Ambulatory Visit (INDEPENDENT_AMBULATORY_CARE_PROVIDER_SITE_OTHER): Payer: Self-pay | Admitting: Vascular Surgery

## 2023-10-12 DIAGNOSIS — I83812 Varicose veins of left lower extremities with pain: Secondary | ICD-10-CM

## 2023-10-15 ENCOUNTER — Other Ambulatory Visit (INDEPENDENT_AMBULATORY_CARE_PROVIDER_SITE_OTHER)

## 2023-10-15 DIAGNOSIS — I83812 Varicose veins of left lower extremities with pain: Secondary | ICD-10-CM

## 2023-11-05 ENCOUNTER — Ambulatory Visit (INDEPENDENT_AMBULATORY_CARE_PROVIDER_SITE_OTHER): Admitting: Vascular Surgery

## 2023-11-09 ENCOUNTER — Ambulatory Visit
Admission: RE | Admit: 2023-11-09 | Discharge: 2023-11-09 | Disposition: A | Discharge status: Home / Self Care | Source: Ambulatory Visit | Attending: Urology | Admitting: Urology

## 2023-11-09 ENCOUNTER — Ambulatory Visit
Admission: RE | Admit: 2023-11-09 | Discharge: 2023-11-09 | Disposition: A | Discharge status: Home / Self Care | Attending: Urology | Admitting: Urology

## 2023-11-09 ENCOUNTER — Other Ambulatory Visit: Admission: RE | Admit: 2023-11-09 | Source: Home / Self Care

## 2023-11-09 ENCOUNTER — Ambulatory Visit (INDEPENDENT_AMBULATORY_CARE_PROVIDER_SITE_OTHER): Payer: Self-pay | Admitting: Urology

## 2023-11-09 VITALS — BP 159/77 | HR 73 | Wt 230.0 lb

## 2023-11-09 DIAGNOSIS — N200 Calculus of kidney: Secondary | ICD-10-CM | POA: Diagnosis not present

## 2023-11-09 DIAGNOSIS — N3281 Overactive bladder: Secondary | ICD-10-CM | POA: Diagnosis not present

## 2023-11-09 NOTE — Patient Instructions (Signed)
 SABRA

## 2023-11-09 NOTE — Progress Notes (Signed)
   11/09/2023 10:20 AM   Channing GORMAN Stalling June 14, 1950 [id number]  Reason for visit: Follow up nephrolithiasis, new urinary symptoms  HPI: 73 year old female with extensive history of recurrent stone disease, previously followed by Dr. Ike and Dr. Twylla.  Underwent left-sided ureteroscopy most recently in 2021 in 2023.  She has been on potassium citrate  intermittently, has been off over the last year secondary to GI upset.  She tries to drink Crystal light lemonade for citrate supplementation.  She has had both calcium oxalate and calcium phosphate stones.  Primary risk factors on 24-hour urine have been low urine volume, hypercalciuria, low citrate.  Previously tried indapamide  with no change in urine calcium levels.  I personally viewed and interpreted the KUB today that shows relatively stable left-sided renal stone burden, 1 cm lower pole stone, smaller midpole stones.  She would like to continue surveillance for her left-sided stones, return precautions were discussed.  She has some new urinary symptoms over the last year primarily urgency and rare urge incontinence.  We discussed strategies including Kegel/pelvic floor exercises/avoid bladder irritants were trialed medicines.  She is not bothered enough to trial an OAB medication at this point.  RTC 1 year KUB prior   Redell JAYSON Burnet, MD  [facility] [address] [place], [place] [address] [phone]

## 2023-11-10 ENCOUNTER — Encounter (INDEPENDENT_AMBULATORY_CARE_PROVIDER_SITE_OTHER): Payer: Self-pay | Admitting: Nurse Practitioner

## 2023-11-10 ENCOUNTER — Ambulatory Visit (INDEPENDENT_AMBULATORY_CARE_PROVIDER_SITE_OTHER): Admitting: Nurse Practitioner

## 2023-11-10 VITALS — BP 148/83 | HR 76 | Ht 69.0 in | Wt 237.0 lb

## 2023-11-10 DIAGNOSIS — I83893 Varicose veins of bilateral lower extremities with other complications: Secondary | ICD-10-CM | POA: Diagnosis not present

## 2023-11-15 ENCOUNTER — Encounter (INDEPENDENT_AMBULATORY_CARE_PROVIDER_SITE_OTHER): Payer: Self-pay | Admitting: Nurse Practitioner

## 2023-11-15 NOTE — Progress Notes (Signed)
 Subjective:    Patient ID: Maureen Brown, female    DOB: [birth year], 73 y.o.   MRN: [id number] Chief Complaint  Patient presents with   Follow-up     - 4 week post left leg GSV laser. see fb/jd     The patient returns to the office for followup status post laser ablation of the left  great saphenous vein on 10/08/23.  She notes that she began to have some swelling and tenderness in her calf post endovenous ablation.  That has resolved but she still continues to have some pain and discomfort in the leg..  The patient is otherwise done well and there have been no complications related to the laser procedure or interval changes in the patient's overall   Venous ultrasound post laser shows successful laser ablation of the left gsv, no DVT identified.    Review of Systems  Cardiovascular:  Positive for leg swelling.  All other systems reviewed and are negative.      Objective:   Physical Exam Vitals reviewed.  HENT:     Head: Normocephalic.  Cardiovascular:     Rate and Rhythm: Normal rate.     Pulses: Normal pulses.  Pulmonary:     Effort: Pulmonary effort is normal.  Musculoskeletal:        General: Tenderness present.  Skin:    General: Skin is warm and dry.  Neurological:     Mental Status: She is alert and oriented to person, place, and time.  Psychiatric:        Mood and Affect: Mood normal.        Behavior: Behavior normal.        Thought Content: Thought content normal.        Judgment: Judgment normal.     BP (!) 148/83   Pulse 76   Ht 5' 9 (1.753 m)   Wt 237 lb (107.5 kg)   BMI 35.00 kg/m   Past Medical History:  Diagnosis Date   Anemia    Anginal pain (HCC)    Anxiety    Arthritis    Asthma 1989   WELL CONTROLLED   Cancer (HCC) 1989   thyroid    Chest pain    COPD (chronic obstructive pulmonary disease) (HCC)    Cystitis, unspecified    Depression    Dyspnea    RELATED TO ASTHMA   GERD (gastroesophageal reflux disease)    H/O   Grief  reaction 2008   Herpes labialis    History of kidney stones    Hypothyroidism    Insomnia    Kidney stone 2014, 2018   Leg edema    Leukopenia    Motion sickness    Boats   Pleural effusion    Pneumonia    PONV (postoperative nausea and vomiting)    HYSTERECTOMY   Postsurgical hypoparathyroidism (HCC)    Stress incontinence    Thrombocytopenia (HCC)    Varicose veins of lower extremities with other complications    Vitamin B 12 deficiency     Social History   Socioeconomic History   Marital status: Married    Spouse name: Marcey   Number of children: Not on file   Years of education: Not on file   Highest education level: Not on file  Occupational History   Not on file  Tobacco Use   Smoking status: Never    Passive exposure: Never   Smokeless tobacco: Never  Vaping Use   Vaping status: Never  Used  Substance and Sexual Activity   Alcohol use: Yes    Comment: occassional   Drug use: No   Sexual activity: Yes    Birth control/protection: Post-menopausal  Other Topics Concern   Not on file  Social History Narrative   Not on file   Social Drivers of Health   Financial Resource Strain: Low Risk  (10/15/2022)   Received from [facility]   Overall Financial Resource Strain (CARDIA)    Difficulty of Paying Living Expenses: Not hard at all  Food Insecurity: No Food Insecurity (10/15/2022)   Received from [facility]   Hunger Vital Sign    Within the past 12 months, you worried that your food would run out before you got the money to buy more.: Never true    Within the past 12 months, the food you bought just didn't last and you didn't have money to get more.: Never true  Transportation Needs: No Transportation Needs (10/15/2022)   Received from [facility] - Transportation    In the past 12 months, has lack of transportation kept you from medical appointments or from getting medications?: No    Lack of  Transportation (Non-Medical): No  Physical Activity: Not on file  Stress: Not on file  Social Connections: Not on file  Intimate Partner Violence: Not on file    Past Surgical History:  Procedure Laterality Date   ABDOMINAL HYSTERECTOMY  1983   ABLATION SAPHENOUS VEIN W/ RFA Left 09/2012   GSV   APPENDECTOMY     BREAST BIOPSY Right 1973   neg   BREAST BIOPSY Left 08/26/2017   Papilloma- X clip   BREAST CYST REMOVAL     BREAST EXCISIONAL BIOPSY Left 09/20/2017   papalloma   BREAST LUMPECTOMY WITH NEEDLE LOCALIZATION Left 09/20/2017   Procedure: BREAST LUMPECTOMY WITH NEEDLE LOCALIZATION;  Surgeon: Rodolph Romano, MD;  Location: [facility];  Service: General;  Laterality: Left;   CATARACT EXTRACTION W/PHACO Right 01/15/2021   Procedure: CATARACT EXTRACTION PHACO AND INTRAOCULAR LENS PLACEMENT (IOC) RIGHT 14.70 01:35.4;  Surgeon: Mittie Gaskin, MD;  Location: [facility];  Service: Ophthalmology;  Laterality: Right;   CATARACT EXTRACTION W/PHACO Left 01/29/2021   Procedure: CATARACT EXTRACTION PHACO AND INTRAOCULAR LENS PLACEMENT (IOC) LEFT 5.02 00:49.8;  Surgeon: Mittie Gaskin, MD;  Location: [facility];  Service: Ophthalmology;  Laterality: Left;   CHOLECYSTECTOMY  2002   COLONOSCOPY  2009   Dr. Viktoria   COLONOSCOPY N/A 02/27/2020   Procedure: COLONOSCOPY;  Surgeon: Maryruth Ole DASEN, MD;  Location: [facility] ENDOSCOPY;  Service: Endoscopy;  Laterality: N/A;   COLONOSCOPY WITH PROPOFOL  N/A 09/30/2015   Procedure: COLONOSCOPY WITH PROPOFOL ;  Surgeon: Lamar DASEN Viktoria, MD;  Location: [facility] ENDOSCOPY;  Service: Endoscopy;  Laterality: N/A;   CYSTOSCOPY W/ RETROGRADES Bilateral 11/10/2019   Procedure: CYSTOSCOPY WITH RETROGRADE PYELOGRAM;  Surgeon: Francisca Redell BROCKS, MD;  Location: [facility];  Service: Urology;  Laterality: Bilateral;   CYSTOSCOPY/URETEROSCOPY/HOLMIUM LASER/STENT PLACEMENT Left 11/10/2019   Procedure: CYSTOSCOPY/URETEROSCOPY/HOLMIUM LASER/STENT  PLACEMENT;  Surgeon: Francisca Redell BROCKS, MD;  Location: [facility];  Service: Urology;  Laterality: Left;   CYSTOSCOPY/URETEROSCOPY/HOLMIUM LASER/STENT PLACEMENT Left 08/29/2021   Procedure: CYSTOSCOPY/URETEROSCOPY/HOLMIUM LASER/STENT PLACEMENT/LEFT RETROGRADE PYELOGRAM;  Surgeon: Francisca Redell BROCKS, MD;  Location: [facility];  Service: Urology;  Laterality: Left;   KIDNEY STONE SURGERY  2014   KIDNEY STONE SURGERY  06/27/13, 07-09-16   Dr Ike   KNEE SURGERY Right 1989  THYROID  SURGERY  1988   TONSILLECTOMY     TOTAL THYROIDECTOMY  2011   UPPER GI ENDOSCOPY  2013    Family History  Problem Relation Age of Onset   Breast cancer Maternal Grandmother    Breast cancer Maternal Aunt    Thyroid  cancer Paternal Grandmother    Heart disease Mother    Heart disease Father     Allergies  Allergen Reactions   Codeine Nausea Only    dizziness   Hydrochlorothiazide     Unknown   Hydrocodone -Acetaminophen  Nausea And Vomiting and Other (See Comments)    Loss of balance, causes falls   Sulfa Antibiotics Hives   Advair Diskus [Fluticasone -Salmeterol] Rash   Augmentin [Amoxicillin -Pot Clavulanate] Rash    Has patient had a PCN reaction causing immediate rash, facial/tongue/throat swelling, SOB or lightheadedness with hypotension: No Has patient had a PCN reaction causing severe rash involving mucus membranes or skin necrosis: No Has patient had a PCN reaction that required hospitalization: No Has patient had a PCN reaction occurring within the last 10 years: No If all of the above answers are NO, then may proceed with Cephalosporin use.    Cephalosporins Rash   Ciprofloxacin Rash   Demerol [Meperidine] Rash    Throat itching   Gluten Meal Nausea And Vomiting and Rash    Wheat, Barley and Rye   Keflex [Cephalexin] Rash   Morphine  And Codeine Rash       Latest Ref Rng & Units 03/07/2021   10:50 AM 08/21/2019   10:38 AM 05/19/2019   11:27 AM  CBC  WBC 4.0 - 10.5 K/uL 5.7  3.1  3.2    Hemoglobin 12.0 - 15.0 g/dL 88.3  9.5    88.9  88.8   Hematocrit 36.0 - 46.0 % 34.2  32.8  34.5   Platelets 150 - 400 K/uL 138  127  144       CMP     Component Value Date/Time   NA 134 (L) 03/07/2021 1050   K 3.7 03/07/2021 1050   CL 103 03/07/2021 1050   CO2 26 03/07/2021 1050   GLUCOSE 142 (H) 03/07/2021 1050   BUN 22 03/07/2021 1050   CREATININE 1.54 (H) 03/07/2021 1050   CALCIUM 10.1 03/07/2021 1050   PROT 7.5 03/07/2021 1059   ALBUMIN 4.2 03/07/2021 1059   AST 34 03/07/2021 1059   ALT 23 03/07/2021 1059   ALKPHOS 40 03/07/2021 1059   BILITOT 1.6 (H) 03/07/2021 1059   GFRNONAA 37 (L) 03/07/2021 1050     No results found.     Assessment & Plan:   1. Varicose veins of bilateral lower extremities with other complications (Primary) Patient still continues to have some pain and tenderness.  We discussed further treatment such as sclerotherapy but at this time she wishes to have some healing time before she considers.  At this time we will have the patient return in 6 to 8 weeks and we can discuss sclerotherapy at that time.   Current Outpatient Medications on File Prior to Visit  Medication Sig Dispense Refill   ALPRAZolam  (XANAX ) 0.25 MG tablet Take 0.25 mg by mouth every 6 (six) hours as needed for anxiety.      ALPRAZolam  (XANAX ) 0.5 MG tablet Take 1st tablet 1 hour prior before procedure and take 2nd tablet once arrived at the office 2 tablet 0   calcitRIOL (ROCALTROL) 0.5 MCG capsule Take 1 mcg by mouth daily.     Cholecalciferol 125 MCG (5000  UT) capsule Take 5,000 Units by mouth daily.      clobetasol ointment (TEMOVATE) 0.05 % Apply 1 application  topically daily as needed (Rash).     Cyanocobalamin (VITAMIN B 12 PO) Take 1,000 mcg by mouth daily.     dapsone 25 MG tablet Take 50 mg by mouth every morning.     diphenhydrAMINE (BENADRYL) 25 MG tablet Take 25 mg by mouth every 6 (six) hours as needed.     fluticasone  (FLONASE ) 50 MCG/ACT nasal spray Place 2  sprays into both nostrils daily. 16 g 0   furosemide (LASIX) 20 MG tablet Take 20 mg by mouth daily as needed for edema or fluid.     naphazoline-pheniramine (NAPHCON-A) 0.025-0.3 % ophthalmic solution Place 1 drop into both eyes 4 (four) times daily as needed for eye irritation. 15 mL 0   pyridoxine (B-6) 100 MG tablet Take 100 mg by mouth daily.     Spacer/Aero-Holding Chambers (AEROCHAMBER MV) inhaler Use as instructed 1 each 1   SYNTHROID 137 MCG tablet Take 68.5-137 mcg by mouth See admin instructions. Take 137 mcg by mouth daily except on Saturday take 68.5 mcg     tamsulosin (FLOMAX) 0.4 MG CAPS capsule Take by mouth daily.     valACYclovir (VALTREX) 1000 MG tablet Take 1,000 mg by mouth daily as needed (cold sores).     Vitamin D, Ergocalciferol, 50000 units CAPS Take by mouth.     chlorpheniramine-HYDROcodone  (TUSSIONEX) 10-8 MG/5ML Take 5 mLs by mouth every 12 (twelve) hours as needed for cough. (Patient not taking: Reported on 11/10/2023) 60 mL 0   ferrous gluconate (FERGON) 240 (27 FE) MG tablet Take 240 mg by mouth daily as needed (low iron). (Patient not taking: Reported on 11/10/2023)     folic acid  (FOLVITE ) 1 MG tablet Take 1 mg by mouth daily.  (Patient not taking: Reported on 11/10/2023)     No current facility-administered medications on file prior to visit.    There are no Patient Instructions on file for this visit. No follow-ups on file.   Baya Lentz E Lagina Reader, NP

## 2023-12-24 ENCOUNTER — Encounter (INDEPENDENT_AMBULATORY_CARE_PROVIDER_SITE_OTHER): Payer: Self-pay | Admitting: Vascular Surgery

## 2023-12-24 ENCOUNTER — Ambulatory Visit (INDEPENDENT_AMBULATORY_CARE_PROVIDER_SITE_OTHER): Admitting: Vascular Surgery

## 2023-12-24 VITALS — BP 146/88 | HR 69 | Resp 18 | Ht 69.0 in | Wt 236.8 lb

## 2023-12-24 DIAGNOSIS — N183 Chronic kidney disease, stage 3 unspecified: Secondary | ICD-10-CM | POA: Diagnosis not present

## 2023-12-24 DIAGNOSIS — I83812 Varicose veins of left lower extremities with pain: Secondary | ICD-10-CM | POA: Diagnosis not present

## 2023-12-24 NOTE — Progress Notes (Signed)
 MRN : [id number]  Maureen Brown is a 73 y.o. (April 16, 1950) female who presents with chief complaint of  Chief Complaint  Patient presents with   Follow-up    6-8 week follow up no studies - vein ablation Left leg  .  History of Present Illness: Patient returns today in follow up of her venous disease.  She underwent laser ablation of the left great saphenous vein about 10 weeks ago.  This was very tender, red, and sore for several weeks postprocedure.  Her leg is now feeling better and she sees improvement after the laser ablation.  Her postprocedural duplex showed no evidence of DVT or superficial thrombophlebitis with a successful ablation.  Current Outpatient Medications  Medication Sig Dispense Refill   ALPRAZolam  (XANAX ) 0.25 MG tablet Take 0.25 mg by mouth every 6 (six) hours as needed for anxiety.      ALPRAZolam  (XANAX ) 0.5 MG tablet Take 1st tablet 1 hour prior before procedure and take 2nd tablet once arrived at the office 2 tablet 0   calcitRIOL (ROCALTROL) 0.5 MCG capsule Take 1 mcg by mouth daily.     chlorpheniramine-HYDROcodone  (TUSSIONEX) 10-8 MG/5ML Take 5 mLs by mouth every 12 (twelve) hours as needed for cough. 60 mL 0   Cholecalciferol 125 MCG (5000 UT) capsule Take 5,000 Units by mouth daily.      clobetasol ointment (TEMOVATE) 0.05 % Apply 1 application  topically daily as needed (Rash).     Cyanocobalamin (VITAMIN B 12 PO) Take 1,000 mcg by mouth daily.     dapsone 25 MG tablet Take 50 mg by mouth every morning.     diphenhydrAMINE (BENADRYL) 25 MG tablet Take 25 mg by mouth every 6 (six) hours as needed.     ferrous gluconate (FERGON) 240 (27 FE) MG tablet Take 240 mg by mouth daily as needed (low iron).     fluticasone  (FLONASE ) 50 MCG/ACT nasal spray Place 2 sprays into both nostrils daily. 16 g 0   folic acid  (FOLVITE ) 1 MG tablet Take 1 mg by mouth daily.      furosemide (LASIX) 20 MG tablet Take 20 mg by mouth daily as needed for edema or fluid.      naphazoline-pheniramine (NAPHCON-A) 0.025-0.3 % ophthalmic solution Place 1 drop into both eyes 4 (four) times daily as needed for eye irritation. 15 mL 0   pyridoxine (B-6) 100 MG tablet Take 100 mg by mouth daily.     Spacer/Aero-Holding Chambers (AEROCHAMBER MV) inhaler Use as instructed 1 each 1   SYNTHROID 137 MCG tablet Take 68.5-137 mcg by mouth See admin instructions. Take 137 mcg by mouth daily except on Saturday take 68.5 mcg     tamsulosin (FLOMAX) 0.4 MG CAPS capsule Take by mouth daily.     valACYclovir (VALTREX) 1000 MG tablet Take 1,000 mg by mouth daily as needed (cold sores).     Vitamin D, Ergocalciferol, 50000 units CAPS Take by mouth.     No current facility-administered medications for this visit.    Past Medical History:  Diagnosis Date   Anemia    Anginal pain    Anxiety    Arthritis    Asthma 1989   WELL CONTROLLED   Cancer (HCC) 1989   thyroid    Chest pain    COPD (chronic obstructive pulmonary disease) (HCC)    Cystitis, unspecified    Depression    Dyspnea    RELATED TO ASTHMA   GERD (gastroesophageal reflux disease)  H/O   Grief reaction 2008   Herpes labialis    History of kidney stones    Hypothyroidism    Insomnia    Kidney stone 2014, 2018   Leg edema    Leukopenia    Motion sickness    Boats   Pleural effusion    Pneumonia    PONV (postoperative nausea and vomiting)    HYSTERECTOMY   Postsurgical hypoparathyroidism    Stress incontinence    Thrombocytopenia    Varicose veins of lower extremities with other complications    Vitamin B 12 deficiency     Past Surgical History:  Procedure Laterality Date   ABDOMINAL HYSTERECTOMY  1983   ABLATION SAPHENOUS VEIN W/ RFA Left 09/2012   GSV   APPENDECTOMY     BREAST BIOPSY Right 1973   neg   BREAST BIOPSY Left 08/26/2017   Papilloma- X clip   BREAST CYST REMOVAL     BREAST EXCISIONAL BIOPSY Left 09/20/2017   papalloma   BREAST LUMPECTOMY WITH NEEDLE LOCALIZATION Left 09/20/2017    Procedure: BREAST LUMPECTOMY WITH NEEDLE LOCALIZATION;  Surgeon: Rodolph Romano, MD;  Location: [facility] OR[facility];  Service: General;  Laterality: Left;   CATARACT EXTRACTION W/PHACO Right 01/15/2021   Procedure: CATARACT EXTRACTION PHACO AND INTRAOCULAR LENS PLACEMENT (IOC) RIGHT 14.70 01:35.4;  Surgeon: Mittie Gaskin, MD;  [facility];  Service: Ophthalmology;  Laterality: Right;   CATARACT EXTRACTION W/PHACO Left 01/29/2021   Procedure: CATARACT EXTRACTION PHACO AND INTRAOCULAR LENS PLACEMENT (IOC) LEFT 5.02 00:49.8;  Surgeon: Mittie Gaskin, MD;  Location: [facility];  Service: Ophthalmology;  Laterality: Left;   CHOLECYSTECTOMY  2002   COLONOSCOPY  2009   Dr. Viktoria   COLONOSCOPY N/A 02/27/2020   Procedure: COLONOSCOPY;  Surgeon: Maryruth Ole DASEN, MD;  Location: [facility] ENDOSCOPY;  Service: Endoscopy;  Laterality: N/A;   COLONOSCOPY WITH PROPOFOL  N/A 09/30/2015   Procedure: COLONOSCOPY WITH PROPOFOL ;  Surgeon: Lamar DASEN Viktoria, MD;  Location: [facility] ENDOSCOPY;  Service: Endoscopy;  Laterality: N/A;   CYSTOSCOPY W/ RETROGRADES Bilateral 11/10/2019   Procedure: CYSTOSCOPY WITH RETROGRADE PYELOGRAM;  Surgeon: Francisca Redell BROCKS, MD;  Location: [facility];  Service: Urology;  Laterality: Bilateral;   CYSTOSCOPY/URETEROSCOPY/HOLMIUM LASER/STENT PLACEMENT Left 11/10/2019   Procedure: CYSTOSCOPY/URETEROSCOPY/HOLMIUM LASER/STENT PLACEMENT;  Surgeon: Francisca Redell BROCKS, MD;  Location: [facility];  Service: Urology;  Laterality: Left;   CYSTOSCOPY/URETEROSCOPY/HOLMIUM LASER/STENT PLACEMENT Left 08/29/2021   Procedure: CYSTOSCOPY/URETEROSCOPY/HOLMIUM LASER/STENT PLACEMENT/LEFT RETROGRADE PYELOGRAM;  Surgeon: Francisca Redell BROCKS, MD;  Location: [facility];  Service: Urology;  Laterality: Left;   KIDNEY STONE SURGERY  2014   KIDNEY STONE SURGERY  06/27/13, 07-09-16   Dr Ike   KNEE SURGERY Right 1989   THYROID  SURGERY  1988   TONSILLECTOMY     TOTAL THYROIDECTOMY  2011   UPPER GI  ENDOSCOPY  2013     Social History   Tobacco Use   Smoking status: Never    Passive exposure: Never   Smokeless tobacco: Never  Vaping Use   Vaping status: Never Used  Substance Use Topics   Alcohol use: Yes    Comment: occassional   Drug use: No       Family History  Problem Relation Age of Onset   Breast cancer Maternal Grandmother    Breast cancer Maternal Aunt    Thyroid  cancer Paternal Grandmother    Heart disease Mother    Heart disease Father      Allergies  Allergen Reactions   Codeine Nausea Only  dizziness   Hydrochlorothiazide     Unknown   Hydrocodone -Acetaminophen  Nausea And Vomiting and Other (See Comments)    Loss of balance, causes falls   Sulfa Antibiotics Hives   Advair Diskus [Fluticasone -Salmeterol] Rash   Augmentin [Amoxicillin -Pot Clavulanate] Rash    Has patient had a PCN reaction causing immediate rash, facial/tongue/throat swelling, SOB or lightheadedness with hypotension: No Has patient had a PCN reaction causing severe rash involving mucus membranes or skin necrosis: No Has patient had a PCN reaction that required hospitalization: No Has patient had a PCN reaction occurring within the last 10 years: No If all of the above answers are NO, then may proceed with Cephalosporin use.    Cephalosporins Rash   Ciprofloxacin Rash   Demerol [Meperidine] Rash    Throat itching   Gluten Meal Nausea And Vomiting and Rash    Wheat, Barley and Rye   Keflex [Cephalexin] Rash   Morphine  And Codeine Rash     REVIEW OF SYSTEMS (Negative unless checked)  Constitutional: [] Weight loss  [] Fever  [] Chills Cardiac: [] Chest pain   [] Chest pressure   [] Palpitations   [] Shortness of breath when laying flat   [] Shortness of breath at rest   [] Shortness of breath with exertion. Vascular:  [] Pain in legs with walking   [x] Pain in legs at rest   [] Pain in legs when laying flat   [] Claudication   [] Pain in feet when walking  [] Pain in feet at rest  [] Pain  in feet when laying flat   [] History of DVT   [] Phlebitis   [x] Swelling in legs   [x] Varicose veins   [] Non-healing ulcers Pulmonary:   [] Uses home oxygen   [] Productive cough   [] Hemoptysis   [] Wheeze  [x] COPD   [] Asthma Neurologic:  [] Dizziness  [] Blackouts   [] Seizures   [] History of stroke   [] History of TIA  [] Aphasia   [] Temporary blindness   [] Dysphagia   [] Weakness or numbness in arms   [] Weakness or numbness in legs Musculoskeletal:  [] Arthritis   [] Joint swelling   [x] Joint pain   [] Low back pain Hematologic:  [] Easy bruising  [] Easy bleeding   [] Hypercoagulable state   [x] Anemic   Gastrointestinal:  [] Blood in stool   [] Vomiting blood  [] Gastroesophageal reflux/heartburn   [] Abdominal pain Genitourinary:  [] Chronic kidney disease   [] Difficult urination  [] Frequent urination  [] Burning with urination   [] Hematuria Skin:  [] Rashes   [] Ulcers   [] Wounds Psychological:  [x] History of anxiety   []  History of major depression.  Physical Examination  BP (!) 146/88 (BP Location: Right Arm, Patient Position: Sitting, Cuff Size: Normal)   Pulse 69   Resp 18   Ht 5' 9 (1.753 m)   Wt 236 lb 12.8 oz (107.4 kg)   BMI 34.97 kg/m  Gen:  WD/WN, NAD. Appears younger than stated age. Head: [place]/AT, No temporalis wasting. Ear/Nose/Throat: Hearing grossly intact, nares w/o erythema or drainage Eyes: Conjunctiva clear. Sclera non-icteric Neck: Supple.  Trachea midline Pulmonary:  Good air movement, no use of accessory muscles.  Cardiac: RRR, no JVD Vascular:  Vessel Right Left  Radial Palpable Palpable           Musculoskeletal: M/S 5/5 throughout.  No deformity or atrophy.  Diffuse varicosities a little worse on the left than the right.  Trace left lower extremity edema. Neurologic: Sensation grossly intact in extremities.  Symmetrical.  Speech is fluent.  Psychiatric: Judgment intact, Mood & affect appropriate for pt's clinical situation. Dermatologic: No rashes or ulcers  noted.  No  cellulitis or open wounds.      Labs No results found for this or any previous visit (from the past 2160 hours).  Radiology No results found.  Assessment/Plan  Varicose veins of leg with pain, left She is improving and doing well after laser ablation of the left great saphenous vein.  Duplex showed a successful ablation.  She had significant pain after the procedure and a lot of inflammation but that seems to be improving.  Her residual varicosities are not currently causing her enough issue to perform sclerotherapy, but she will continue to monitor these.  I will plan to see her back in 6 months.  CKD (chronic kidney disease) stage 3, GFR 30-59 ml/min (HCC) Can worsen lower extremity swelling.    Selinda Gu, MD  12/24/2023 11:24 AM    This note was created with Dragon medical transcription system.  Any errors from dictation are purely unintentional

## 2023-12-24 NOTE — Assessment & Plan Note (Signed)
 She is improving and doing well after laser ablation of the left great saphenous vein.  Duplex showed a successful ablation.  She had significant pain after the procedure and a lot of inflammation but that seems to be improving.  Her residual varicosities are not currently causing her enough issue to perform sclerotherapy, but she will continue to monitor these.  I will plan to see her back in 6 months.

## 2023-12-24 NOTE — Assessment & Plan Note (Signed)
Can worsen lower extremity swelling

## 2024-06-23 ENCOUNTER — Ambulatory Visit (INDEPENDENT_AMBULATORY_CARE_PROVIDER_SITE_OTHER): Admitting: Vascular Surgery

## 2024-11-09 ENCOUNTER — Ambulatory Visit: Admitting: Urology
